# Patient Record
Sex: Female | Born: 1965
Health system: Southern US, Community
[De-identification: ages and names within clinical notes are randomized; demographics above are authoritative.]

## PROBLEM LIST (undated history)

## (undated) DIAGNOSIS — Z923 Personal history of irradiation: Secondary | ICD-10-CM

## (undated) DIAGNOSIS — K5909 Other constipation: Secondary | ICD-10-CM

## (undated) DIAGNOSIS — E739 Lactose intolerance, unspecified: Secondary | ICD-10-CM

## (undated) DIAGNOSIS — K648 Other hemorrhoids: Secondary | ICD-10-CM

## (undated) DIAGNOSIS — I10 Essential (primary) hypertension: Secondary | ICD-10-CM

## (undated) DIAGNOSIS — Z973 Presence of spectacles and contact lenses: Secondary | ICD-10-CM

## (undated) DIAGNOSIS — F419 Anxiety disorder, unspecified: Secondary | ICD-10-CM

## (undated) HISTORY — DX: Essential (primary) hypertension: I10

## (undated) HISTORY — DX: Anxiety disorder, unspecified: F41.9

---

## 1997-09-28 HISTORY — PX: AUGMENTATION MAMMAPLASTY: SUR837

## 1997-09-28 HISTORY — PX: PLACEMENT OF BREAST IMPLANTS: SHX6334

## 2007-01-11 ENCOUNTER — Encounter: Admission: RE | Admit: 2007-01-11 | Discharge: 2007-01-11 | Payer: Self-pay | Admitting: Family Medicine

## 2010-03-20 ENCOUNTER — Other Ambulatory Visit: Admission: RE | Admit: 2010-03-20 | Discharge: 2010-03-20 | Payer: Self-pay | Admitting: Family Medicine

## 2011-02-03 ENCOUNTER — Other Ambulatory Visit: Payer: Self-pay | Admitting: Family Medicine

## 2011-02-03 ENCOUNTER — Ambulatory Visit
Admission: RE | Admit: 2011-02-03 | Discharge: 2011-02-03 | Disposition: A | Discharge status: Home / Self Care | Payer: 59 | Source: Ambulatory Visit | Attending: Family Medicine | Admitting: Family Medicine

## 2011-02-03 DIAGNOSIS — M549 Dorsalgia, unspecified: Secondary | ICD-10-CM

## 2012-05-12 ENCOUNTER — Other Ambulatory Visit: Payer: Self-pay | Admitting: Family Medicine

## 2012-05-12 DIAGNOSIS — N6325 Unspecified lump in the left breast, overlapping quadrants: Secondary | ICD-10-CM

## 2012-05-12 DIAGNOSIS — N644 Mastodynia: Secondary | ICD-10-CM

## 2012-05-13 ENCOUNTER — Ambulatory Visit
Admission: RE | Admit: 2012-05-13 | Discharge: 2012-05-13 | Disposition: A | Discharge status: Home / Self Care | Payer: 59 | Source: Ambulatory Visit | Attending: Family Medicine | Admitting: Family Medicine

## 2012-05-13 ENCOUNTER — Other Ambulatory Visit: Payer: Self-pay | Admitting: Family Medicine

## 2012-05-13 ENCOUNTER — Other Ambulatory Visit: Payer: Self-pay

## 2012-05-13 DIAGNOSIS — N6002 Solitary cyst of left breast: Secondary | ICD-10-CM

## 2012-05-13 DIAGNOSIS — N644 Mastodynia: Secondary | ICD-10-CM

## 2012-05-13 DIAGNOSIS — N6325 Unspecified lump in the left breast, overlapping quadrants: Secondary | ICD-10-CM

## 2012-07-20 ENCOUNTER — Other Ambulatory Visit: Payer: Self-pay | Admitting: Gastroenterology

## 2012-07-20 DIAGNOSIS — R1011 Right upper quadrant pain: Secondary | ICD-10-CM

## 2012-07-26 ENCOUNTER — Ambulatory Visit
Admission: RE | Admit: 2012-07-26 | Discharge: 2012-07-26 | Disposition: A | Discharge status: Home / Self Care | Payer: 59 | Source: Ambulatory Visit | Attending: Gastroenterology | Admitting: Gastroenterology

## 2012-07-26 DIAGNOSIS — R1011 Right upper quadrant pain: Secondary | ICD-10-CM

## 2014-08-10 ENCOUNTER — Other Ambulatory Visit: Payer: Self-pay | Admitting: Family Medicine

## 2014-08-10 ENCOUNTER — Other Ambulatory Visit (HOSPITAL_COMMUNITY)
Admission: RE | Admit: 2014-08-10 | Discharge: 2014-08-10 | Disposition: A | Discharge status: Home / Self Care | Payer: 59 | Source: Ambulatory Visit | Attending: Family Medicine | Admitting: Family Medicine

## 2014-08-10 DIAGNOSIS — Z124 Encounter for screening for malignant neoplasm of cervix: Secondary | ICD-10-CM | POA: Diagnosis not present

## 2014-08-14 LAB — CYTOLOGY - PAP

## 2014-09-28 HISTORY — PX: BREAST IMPLANT EXCHANGE: SHX6296

## 2014-11-09 ENCOUNTER — Other Ambulatory Visit: Payer: Self-pay | Admitting: Family Medicine

## 2014-11-09 DIAGNOSIS — M5416 Radiculopathy, lumbar region: Secondary | ICD-10-CM

## 2014-11-11 ENCOUNTER — Ambulatory Visit
Admission: RE | Admit: 2014-11-11 | Discharge: 2014-11-11 | Disposition: A | Discharge status: Home / Self Care | Payer: 59 | Source: Ambulatory Visit | Attending: Family Medicine | Admitting: Family Medicine

## 2014-11-11 DIAGNOSIS — M5416 Radiculopathy, lumbar region: Secondary | ICD-10-CM

## 2015-08-26 ENCOUNTER — Ambulatory Visit: Payer: Self-pay | Admitting: General Surgery

## 2015-08-26 NOTE — H&P (Signed)
Toni Bowers 08/26/2015 3:07 PM Location: [facility] Patient #: [id number] DOB: [birth year] Married / Language: Cleophus Molt / Race: White Female  History of Present Illness Toni Hollingshead MD; 08/26/2015 3:27 PM) The patient is a 49 year old female.   Note:She is here for visit following rubber band ligation of her right posterior lateral internal hemorrhoid. She is having less bleeding but still has some prolapse. Still has a bowel movement every other day with some straining at times.  Allergies Elbert Ewings, CMA; 08/26/2015 3:07 PM) Codeine Phosphate *ANALGESICS - OPIOID* Nausea. Sertraline HCl *ANTIDEPRESSANTS* Anaphylaxis. Adhesive 1"x6yd *MEDICAL DEVICES AND SUPPLIES* Rash.  Medication History Elbert Ewings, CMA; 08/26/2015 3:08 PM) BusPIRone HCl (10MG  Tablet, Oral daily) Active. Azelastine HCl (0.1% Solution, Nasal prn) Active. Calcium 600 (1500 (600 Ca)MG Tablet, Oral daily) Active. Align (Oral daily) Active. FiberCon (625MG  Tablet, 1-2 Oral daily) Active. Biotin (5000MCG Tablet, Oral daily) Active. Multiple Vitamin (Oral) Active. Fish Oil (1200MG  Capsule, Oral half of a pill daily) Active. Medications Reconciled    Vitals Elbert Ewings CMA; 08/26/2015 3:08 PM) 08/26/2015 3:08 PM Weight: 108 lb Height: 63in Body Surface Area: 1.49 m Body Mass Index: 19.13 kg/m  Temp.: 97.38F(Temporal)  Pulse: 67 (Regular)  BP: 124/74 (Sitting, Left Arm, Standard)      Physical Exam Toni Hollingshead MD; 08/26/2015 3:28 PM)  The physical exam findings are as follows: Note:Anorectal-no external lesions.  Anoscopy demonstrates a much smaller right posterior lateral hemorrhoid with the anal papilla still present. There appears to be an inflamed small right anterior hemorrhoid. There is also a pedunculated mass, which was not appreciated with the first exam, the prolapses out with manipulation on the left side. Likely a prolapsing  pedunculated internal hemorrhoid. This is smooth without inflammation.    Assessment & Plan Toni Hollingshead MD; 08/26/2015 3:30 PM)  HEMORRHOIDS, INTERNAL, WITH BLEEDING (K64.8) Impression: Bleeding has improved but prolapse from what appears to be a left lateral hemorrhoid is present. The right anterior hemorrhoid also looks inflamed now. This is more that can be done with office treatment.  Plan: I recommended a outpatient hemorrhoidectomy and she is in agreement with it. We discussed postoperative bowel regimen to try to avoid postoperative constipation. The procedure, after care, and risks were discussed with her. Risks including but limited to bleeding, infection, recurrence, anesthesia. We also talked about discomfort. She seems to understand all this and would like to proceed.  Jackolyn Confer, MD

## 2015-10-10 ENCOUNTER — Encounter (HOSPITAL_BASED_OUTPATIENT_CLINIC_OR_DEPARTMENT_OTHER): Payer: Self-pay | Admitting: *Deleted

## 2015-10-10 NOTE — Progress Notes (Signed)
NPO AFTER MN.  ARRIVE AT 0600.  GETTING LAB WORK DONE MON. 10-14-2015 OR TUES. 10-15-2015.  NEEDS URINE PREG.  WILL TAKE AM MED W/ SIPS OF WATER.

## 2015-10-14 DIAGNOSIS — Z79899 Other long term (current) drug therapy: Secondary | ICD-10-CM | POA: Diagnosis not present

## 2015-10-14 DIAGNOSIS — K648 Other hemorrhoids: Secondary | ICD-10-CM | POA: Diagnosis present

## 2015-10-14 DIAGNOSIS — F17200 Nicotine dependence, unspecified, uncomplicated: Secondary | ICD-10-CM | POA: Diagnosis not present

## 2015-10-14 DIAGNOSIS — Z791 Long term (current) use of non-steroidal anti-inflammatories (NSAID): Secondary | ICD-10-CM | POA: Diagnosis not present

## 2015-10-14 LAB — CBC WITH DIFFERENTIAL/PLATELET
BASOS ABS: 0 10*3/uL (ref 0.0–0.1)
BASOS PCT: 0 %
EOS ABS: 0.1 10*3/uL (ref 0.0–0.7)
Eosinophils Relative: 1 %
HCT: 42.3 % (ref 36.0–46.0)
HEMOGLOBIN: 14.3 g/dL (ref 12.0–15.0)
Lymphocytes Relative: 26 %
Lymphs Abs: 2.7 10*3/uL (ref 0.7–4.0)
MCH: 33.3 pg (ref 26.0–34.0)
MCHC: 33.8 g/dL (ref 30.0–36.0)
MCV: 98.4 fL (ref 78.0–100.0)
MONOS PCT: 4 %
Monocytes Absolute: 0.4 10*3/uL (ref 0.1–1.0)
NEUTROS PCT: 69 %
Neutro Abs: 6.9 10*3/uL (ref 1.7–7.7)
Platelets: 222 10*3/uL (ref 150–400)
RBC: 4.3 MIL/uL (ref 3.87–5.11)
RDW: 12.5 % (ref 11.5–15.5)
WBC: 10.1 10*3/uL (ref 4.0–10.5)

## 2015-10-14 LAB — COMPREHENSIVE METABOLIC PANEL
ALBUMIN: 4.7 g/dL (ref 3.5–5.0)
ALK PHOS: 66 U/L (ref 38–126)
ALT: 18 U/L (ref 14–54)
ANION GAP: 9 (ref 5–15)
AST: 24 U/L (ref 15–41)
BUN: 11 mg/dL (ref 6–20)
CALCIUM: 9.9 mg/dL (ref 8.9–10.3)
CO2: 29 mmol/L (ref 22–32)
Chloride: 102 mmol/L (ref 101–111)
Creatinine, Ser: 0.71 mg/dL (ref 0.44–1.00)
GFR calc Af Amer: >60 mL/min (ref >60)
GFR calc non Af Amer: >60 mL/min (ref >60)
GLUCOSE: 102 mg/dL — AB (ref 65–99)
POTASSIUM: 4 mmol/L (ref 3.5–5.1)
SODIUM: 140 mmol/L (ref 135–145)
Total Bilirubin: 0.9 mg/dL (ref 0.3–1.2)
Total Protein: 7.4 g/dL (ref 6.5–8.1)

## 2015-10-14 LAB — PROTIME-INR
INR: 0.96 (ref 0.00–1.49)
Prothrombin Time: 12.9 seconds (ref 11.6–15.2)

## 2015-10-17 ENCOUNTER — Ambulatory Visit (HOSPITAL_BASED_OUTPATIENT_CLINIC_OR_DEPARTMENT_OTHER)
Admission: RE | Admit: 2015-10-17 | Discharge: 2015-10-17 | Disposition: A | Discharge status: Home / Self Care | Payer: 59 | Source: Ambulatory Visit | Attending: General Surgery | Admitting: General Surgery

## 2015-10-17 ENCOUNTER — Encounter (HOSPITAL_BASED_OUTPATIENT_CLINIC_OR_DEPARTMENT_OTHER): Payer: Self-pay

## 2015-10-17 ENCOUNTER — Encounter (HOSPITAL_BASED_OUTPATIENT_CLINIC_OR_DEPARTMENT_OTHER)
Admission: RE | Disposition: A | Discharge status: Home / Self Care | Payer: Self-pay | Source: Ambulatory Visit | Attending: General Surgery

## 2015-10-17 ENCOUNTER — Ambulatory Visit (HOSPITAL_BASED_OUTPATIENT_CLINIC_OR_DEPARTMENT_OTHER): Payer: 59 | Admitting: Anesthesiology

## 2015-10-17 DIAGNOSIS — Z79899 Other long term (current) drug therapy: Secondary | ICD-10-CM | POA: Insufficient documentation

## 2015-10-17 DIAGNOSIS — K648 Other hemorrhoids: Secondary | ICD-10-CM | POA: Diagnosis not present

## 2015-10-17 DIAGNOSIS — Z791 Long term (current) use of non-steroidal anti-inflammatories (NSAID): Secondary | ICD-10-CM | POA: Insufficient documentation

## 2015-10-17 DIAGNOSIS — F17200 Nicotine dependence, unspecified, uncomplicated: Secondary | ICD-10-CM | POA: Insufficient documentation

## 2015-10-17 HISTORY — DX: Presence of spectacles and contact lenses: Z97.3

## 2015-10-17 HISTORY — DX: Other constipation: K59.09

## 2015-10-17 HISTORY — PX: HEMORRHOID SURGERY: SHX153

## 2015-10-17 HISTORY — DX: Lactose intolerance, unspecified: E73.9

## 2015-10-17 HISTORY — DX: Other hemorrhoids: K64.8

## 2015-10-17 LAB — POCT PREGNANCY, URINE: Preg Test, Ur: NEGATIVE

## 2015-10-17 SURGERY — HEMORRHOIDECTOMY
Anesthesia: General

## 2015-10-17 MED ORDER — SODIUM CHLORIDE 0.9 % IJ SOLN
3.0000 mL | INTRAMUSCULAR | Status: DC | PRN
  Filled 2015-10-17: qty 3

## 2015-10-17 MED ORDER — MIDAZOLAM HCL 2 MG/2ML IJ SOLN
INTRAMUSCULAR | Status: AC
  Filled 2015-10-17: qty 2

## 2015-10-17 MED ORDER — OXYCODONE HCL 5 MG PO TABS: 5.0000 mg | ORAL_TABLET | ORAL | Status: DC | PRN

## 2015-10-17 MED ORDER — DEXAMETHASONE SODIUM PHOSPHATE 10 MG/ML IJ SOLN
INTRAMUSCULAR | Status: AC
Start: 2015-10-17 — End: 2015-10-17
  Filled 2015-10-17: qty 1

## 2015-10-17 MED ORDER — SODIUM CHLORIDE 0.9 % IR SOLN
Status: DC | PRN
  Administered 2015-10-17: 500 mL
  Administered 2015-10-17: 10 mL

## 2015-10-17 MED ORDER — CEFOTETAN DISODIUM-DEXTROSE 2-2.08 GM-% IV SOLR
INTRAVENOUS | Status: AC
  Filled 2015-10-17: qty 50

## 2015-10-17 MED ORDER — BUPIVACAINE LIPOSOME 1.3 % IJ SUSP
INTRAMUSCULAR | Status: DC | PRN
  Administered 2015-10-17: 27 mL

## 2015-10-17 MED ORDER — ONDANSETRON HCL 4 MG/2ML IJ SOLN
INTRAMUSCULAR | Status: DC | PRN
  Administered 2015-10-17: 4 mg via INTRAVENOUS

## 2015-10-17 MED ORDER — FENTANYL CITRATE (PF) 100 MCG/2ML IJ SOLN
INTRAMUSCULAR | Status: AC
  Filled 2015-10-17: qty 2

## 2015-10-17 MED ORDER — DEXAMETHASONE SODIUM PHOSPHATE 4 MG/ML IJ SOLN
INTRAMUSCULAR | Status: DC | PRN
  Administered 2015-10-17: 10 mg via INTRAVENOUS

## 2015-10-17 MED ORDER — LIDOCAINE HCL (CARDIAC) 20 MG/ML IV SOLN
INTRAVENOUS | Status: DC | PRN
  Administered 2015-10-17: 70 mg via INTRAVENOUS

## 2015-10-17 MED ORDER — PROMETHAZINE HCL 25 MG/ML IJ SOLN
6.2500 mg | INTRAMUSCULAR | Status: DC | PRN
  Filled 2015-10-17: qty 1

## 2015-10-17 MED ORDER — ACETAMINOPHEN 650 MG RE SUPP
650.0000 mg | RECTAL | Status: DC | PRN
  Filled 2015-10-17: qty 1

## 2015-10-17 MED ORDER — ONDANSETRON HCL 4 MG PO TABS: 4.0000 mg | ORAL_TABLET | ORAL | Status: DC | PRN

## 2015-10-17 MED ORDER — HYDROMORPHONE HCL 1 MG/ML IJ SOLN
0.2500 mg | INTRAMUSCULAR | Status: DC | PRN
  Filled 2015-10-17: qty 1

## 2015-10-17 MED ORDER — DEXTROSE 5 % IV SOLN
2.0000 g | INTRAVENOUS | Status: AC
  Administered 2015-10-17: 2 g via INTRAVENOUS
  Filled 2015-10-17: qty 2

## 2015-10-17 MED ORDER — LACTATED RINGERS IV SOLN
INTRAVENOUS | Status: DC
  Administered 2015-10-17 (×2): via INTRAVENOUS
  Filled 2015-10-17: qty 1000

## 2015-10-17 MED ORDER — MIDAZOLAM HCL 5 MG/5ML IJ SOLN
INTRAMUSCULAR | Status: DC | PRN
  Administered 2015-10-17: 2 mg via INTRAVENOUS
  Administered 2015-10-17: 1 mg via INTRAVENOUS

## 2015-10-17 MED ORDER — LIDOCAINE HCL (CARDIAC) 20 MG/ML IV SOLN
INTRAVENOUS | Status: AC
  Filled 2015-10-17: qty 10

## 2015-10-17 MED ORDER — PROPOFOL 10 MG/ML IV BOLUS
INTRAVENOUS | Status: AC
  Filled 2015-10-17: qty 20

## 2015-10-17 MED ORDER — OXYCODONE HCL 5 MG/5ML PO SOLN
5.0000 mg | Freq: Once | ORAL | Status: DC | PRN
  Filled 2015-10-17: qty 5

## 2015-10-17 MED ORDER — ONDANSETRON HCL 4 MG/2ML IJ SOLN
INTRAMUSCULAR | Status: AC
Start: 2015-10-17 — End: 2015-10-17
  Filled 2015-10-17: qty 2

## 2015-10-17 MED ORDER — SUCCINYLCHOLINE CHLORIDE 20 MG/ML IJ SOLN
INTRAMUSCULAR | Status: DC | PRN
  Administered 2015-10-17: 100 mg via INTRAVENOUS

## 2015-10-17 MED ORDER — GELATIN ABSORBABLE 100 EX MISC
CUTANEOUS | Status: DC | PRN
Start: 2015-10-17 — End: 2015-10-17
  Administered 2015-10-17: 1

## 2015-10-17 MED ORDER — ARTIFICIAL TEARS OP OINT
TOPICAL_OINTMENT | OPHTHALMIC | Status: AC
  Filled 2015-10-17: qty 3.5

## 2015-10-17 MED ORDER — OXYCODONE HCL 5 MG PO TABS
5.0000 mg | ORAL_TABLET | Freq: Once | ORAL | Status: DC | PRN
  Filled 2015-10-17: qty 1

## 2015-10-17 MED ORDER — FENTANYL CITRATE (PF) 100 MCG/2ML IJ SOLN
INTRAMUSCULAR | Status: DC | PRN
  Administered 2015-10-17 (×2): 50 ug via INTRAVENOUS

## 2015-10-17 MED ORDER — ACETAMINOPHEN 325 MG PO TABS
650.0000 mg | ORAL_TABLET | ORAL | Status: DC | PRN
  Filled 2015-10-17: qty 2

## 2015-10-17 MED ORDER — PROPOFOL 10 MG/ML IV BOLUS
INTRAVENOUS | Status: DC | PRN
  Administered 2015-10-17: 150 mg via INTRAVENOUS

## 2015-10-17 MED ORDER — OXYCODONE HCL 5 MG PO TABS
5.0000 mg | ORAL_TABLET | ORAL | Status: DC | PRN
  Filled 2015-10-17: qty 2

## 2015-10-17 MED FILL — ONDANSETRON HCL 4 MG TABLET: 4 | 5 days supply | Qty: 30 | Fill #0

## 2015-10-17 MED FILL — oxyCODONE HCL 5 MG TABS: 5 | 3 days supply | Qty: 40 | Fill #0

## 2015-10-17 SURGICAL SUPPLY — 46 items
APL SKNCLS STERI-STRIP NONHPOA (GAUZE/BANDAGES/DRESSINGS) ×1
BENZOIN TINCTURE PRP APPL 2/3 (GAUZE/BANDAGES/DRESSINGS) ×1 IMPLANT
BLADE SURG 10 STRL SS (BLADE) IMPLANT
BRIEF STRETCH FOR OB PAD LRG (UNDERPADS AND DIAPERS) ×1 IMPLANT
CANISTER SUCTION 1200CC (MISCELLANEOUS) ×2 IMPLANT
COVER BACK TABLE 60X90IN (DRAPES) ×2 IMPLANT
COVER MAYO STAND STRL (DRAPES) ×2 IMPLANT
DRAPE LAPAROTOMY 100X72 PEDS (DRAPES) ×2 IMPLANT
DRAPE LG THREE QUARTER DISP (DRAPES) IMPLANT
DRAPE UNDERBUTTOCKS STRL (DRAPE) IMPLANT
DRAPE UTILITY XL STRL (DRAPES) ×1 IMPLANT
DRSG PAD ABDOMINAL 8X10 ST (GAUZE/BANDAGES/DRESSINGS) ×1 IMPLANT
ELECT REM PT RETURN 9FT ADLT (ELECTROSURGICAL) ×2
ELECTRODE REM PT RTRN 9FT ADLT (ELECTROSURGICAL) ×1 IMPLANT
GLOVE BIO SURGEON STRL SZ8 (GLOVE) ×2 IMPLANT
GOWN STRL REUS W/ TWL XL LVL3 (GOWN DISPOSABLE) ×1 IMPLANT
GOWN STRL REUS W/TWL XL LVL3 (GOWN DISPOSABLE) ×2
IV CATH 22GX1 FEP (IV SOLUTION) ×1 IMPLANT
KIT ROOM TURNOVER WOR (KITS) ×2 IMPLANT
LEGGING LITHOTOMY PAIR STRL (DRAPES) IMPLANT
NDL HYPO 25X1 1.5 SAFETY (NEEDLE) ×1 IMPLANT
NEEDLE HYPO 25X1 1.5 SAFETY (NEEDLE) ×2 IMPLANT
NS IRRIG 500ML POUR BTL (IV SOLUTION) ×2 IMPLANT
PACK BASIN DAY SURGERY FS (CUSTOM PROCEDURE TRAY) ×2 IMPLANT
PAD ABD 8X10 STRL (GAUZE/BANDAGES/DRESSINGS) ×2 IMPLANT
PAD PREP 24X48 CUFFED NSTRL (MISCELLANEOUS) ×2 IMPLANT
PENCIL BUTTON HOLSTER BLD 10FT (ELECTRODE) ×2 IMPLANT
SHEARS HARMONIC 9CM CVD (BLADE) ×1 IMPLANT
SPONGE GAUZE 4X4 12PLY STER LF (GAUZE/BANDAGES/DRESSINGS) ×1 IMPLANT
SPONGE HEMORRHOID 8X3CM (HEMOSTASIS) IMPLANT
SURGILUBE 2OZ TUBE FLIPTOP (MISCELLANEOUS) ×2 IMPLANT
SUT CHROMIC 2 0 SH (SUTURE) IMPLANT
SUT CHROMIC 3 0 PS 2 (SUTURE) IMPLANT
SUT CHROMIC 3 0 SH 27 (SUTURE) IMPLANT
SUT MON AB 4-0 PC3 18 (SUTURE) IMPLANT
SUT SILK 2 0 SH (SUTURE) IMPLANT
SUT VIC AB 4-0 P-3 18XBRD (SUTURE) IMPLANT
SUT VIC AB 4-0 P3 18 (SUTURE)
SYR BULB IRRIGATION 50ML (SYRINGE) ×1 IMPLANT
SYR CONTROL 10ML LL (SYRINGE) ×2 IMPLANT
TOWEL OR 17X24 6PK STRL BLUE (TOWEL DISPOSABLE) ×4 IMPLANT
TRAY DSU PREP LF (CUSTOM PROCEDURE TRAY) ×2 IMPLANT
TUBE CONNECTING 12X1/4 (SUCTIONS) ×1 IMPLANT
UNDERPAD 30X30 INCONTINENT (UNDERPADS AND DIAPERS) ×3 IMPLANT
WATER STERILE IRR 500ML POUR (IV SOLUTION) IMPLANT
YANKAUER SUCT BULB TIP NO VENT (SUCTIONS) ×2 IMPLANT

## 2015-10-17 NOTE — Op Note (Signed)
Operative Note  Toni Bowers female 50 y.o. 10/17/2015  PREOPERATIVE DX:  Bleeding and prolapsing internal hemorrhoids  POSTOPERATIVE DX:  Same  PROCEDURE:   Anal block. Three column internal hemorrhoidectomy         Surgeon: [name]   Assistants: None  Anesthesia: General endotracheal anesthesia with Exparel anal block  Indications:   This is a 50 year old female with bleeding and prolapsing internal hemorrhoids. Rubber band ligation was performed in the office but she she continues to have the problem. She now presents for the above procedure.    Procedure Detail:  She was brought to the operating room supine on the stretcher in the general anesthetic was given. She was placed in the prone jackknife position and padded appropriately.  The perianal area was sterilely prepped and draped. A timeout was performed.  Using Exparel solution, an anal block was performed.  The endoscope was inserted and an enlarged right posterior internal hemorrhoid was identified. Using the Harmonic scalpel, it was removed with care taken to preserve the sphincter muscles.  Next, an enlarged left lateral and prolapsing internal hemorrhoid was identified and it was removed using Harmonic scalpel with care taken to preserve the sphincter muscles. Lastly, a moderately enlargedright anterior hemorrhoid was identified and removed using the Harmonic scalpel with care taken to preserve the sphincter muscles.  The 3 specimens were  Marked appropriately and then sent to pathology. The anal canal was carefully inspected and hemostasis was adequate.A piece of Gelfoam was placed in the anal canal. A bulky dressing was applied.  Subsequently, she was turned supine on the stretcher, extubated, and taken to the recovery room in satisfactory condition. Blood loss was less than 100 cc. There were no apparent complications.    Findings: Enlarged internal hemorrhoids at the right posterior, right anterior, and  left lateral positions with the left lateral hemorrhoid prolapsing.         Specimens: Hemorrhoidal tissue

## 2015-10-17 NOTE — Anesthesia Preprocedure Evaluation (Addendum)
Anesthesia Evaluation  Patient identified by MRN, date of birth, ID band Patient awake    Reviewed: Allergy & Precautions, NPO status , Patient's Chart, lab work & pertinent test results  Airway Mallampati: II  TM Distance: >3 FB Neck ROM: Full    Dental  (+) Dental Advisory Given, Teeth Intact   Pulmonary Current Smoker,    breath sounds clear to auscultation       Cardiovascular negative cardio ROS   Rhythm:Regular Rate:Normal     Neuro/Psych negative neurological ROS     GI/Hepatic negative GI ROS, Neg liver ROS,   Endo/Other  negative endocrine ROS  Renal/GU negative Renal ROS     Musculoskeletal   Abdominal   Peds  Hematology negative hematology ROS (+)   Anesthesia Other Findings   Reproductive/Obstetrics                            Lab Results  Component Value Date   WBC 10.1 10/14/2015   HGB 14.3 10/14/2015   HCT 42.3 10/14/2015   MCV 98.4 10/14/2015   PLT 222 10/14/2015   Lab Results  Component Value Date   CREATININE 0.71 10/14/2015   BUN 11 10/14/2015   NA 140 10/14/2015   K 4.0 10/14/2015   CL 102 10/14/2015   CO2 29 10/14/2015    Anesthesia Physical Anesthesia Plan  ASA: I  Anesthesia Plan: General   Post-op Pain Management:    Induction: Intravenous  Airway Management Planned: Oral ETT  Additional Equipment:   Intra-op Plan:   Post-operative Plan: Extubation in OR  Informed Consent: I have reviewed the patients History and Physical, chart, labs and discussed the procedure including the risks, benefits and alternatives for the proposed anesthesia with the patient or authorized representative who has indicated his/her understanding and acceptance.   Dental advisory given  Plan Discussed with: CRNA  Anesthesia Plan Comments:         Anesthesia Quick Evaluation

## 2015-10-17 NOTE — Transfer of Care (Signed)
Last Vitals:  Filed Vitals:   10/17/15 0611  BP: 140/75  Pulse: 57  Temp: 36.7 C  Resp: 14    Immediate Anesthesia Transfer of Care Note  Patient: Toni Bowers  Procedure(s) Performed: Procedure(s) (LRB): HEMORRHOIDECTOMY (N/A)  Patient Location: PACU  Anesthesia Type: General  Level of Consciousness: awake, alert  and oriented  Airway & Oxygen Therapy: Patient Spontanous Breathing and Patient connected to nasal cannula oxygen  Post-op Assessment: Report given to PACU RN and Post -op Vital signs reviewed and stable  Post vital signs: Reviewed and stable  Complications: No apparent anesthesia complications

## 2015-10-17 NOTE — Anesthesia Postprocedure Evaluation (Signed)
Anesthesia Post Note  Patient: Toni Bowers  Procedure(s) Performed: Procedure(s) (LRB): HEMORRHOIDECTOMY (N/A)  Patient location during evaluation: PACU Anesthesia Type: General Level of consciousness: awake and alert Pain management: pain level controlled Vital Signs Assessment: post-procedure vital signs reviewed and stable Respiratory status: spontaneous breathing Cardiovascular status: blood pressure returned to baseline Anesthetic complications: no    Last Vitals:  Filed Vitals:   10/17/15 0931 10/17/15 1010  BP: 159/85 156/71  Pulse: 57 54  Temp:    Resp: 16 18    Last Pain:  Filed Vitals:   10/17/15 1014  PainSc: Asleep                 Tiajuana Amass

## 2015-10-17 NOTE — Anesthesia Procedure Notes (Signed)
Procedure Name: Intubation Date/Time: 10/17/2015 7:47 AM Performed by: Mechele Claude Pre-anesthesia Checklist: Patient identified, Emergency Drugs available, Suction available and Patient being monitored Patient Re-evaluated:Patient Re-evaluated prior to inductionOxygen Delivery Method: Circle System Utilized Preoxygenation: Pre-oxygenation with 100% oxygen Intubation Type: IV induction Ventilation: Mask ventilation without difficulty Laryngoscope Size: Mac and 3 Grade View: Grade II Tube type: Oral Tube size: 7.0 mm Number of attempts: 1 Airway Equipment and Method: Stylet and LTA kit utilized Placement Confirmation: ETT inserted through vocal cords under direct vision,  positive ETCO2 and breath sounds checked- equal and bilateral Secured at: 20 cm Tube secured with: Tape Dental Injury: Teeth and Oropharynx as per pre-operative assessment

## 2015-10-17 NOTE — H&P (Signed)
Toni Bowers   Patient #: [id number] DOB: 1965/10/14 Married / Language: Cleophus Molt / Race: White Female  History of Present Illness  The patient is a 50 year old female.   Note:She is here for visit following rubber band ligation of her right posterior lateral internal hemorrhoid. She is having less bleeding but still has some prolapse. Still has a bowel movement every other day with some straining at times.  Allergies Codeine Phosphate *ANALGESICS - OPIOID* Nausea. Sertraline HCl *ANTIDEPRESSANTS* Anaphylaxis. Adhesive 1"x6yd *MEDICAL DEVICES AND SUPPLIES* Rash.  Prior to Admission medications   Medication Sig Start Date End Date Taking? Authorizing Provider  Biotin 5000 MCG CAPS Take 1 capsule by mouth every morning.   Yes Historical Provider, MD  busPIRone (BUSPAR) 10 MG tablet Take 5 mg by mouth 2 (two) times daily.   Yes Historical Provider, MD  Calcium Carbonate-Vitamin D (CALCIUM 600+D) 600-400 MG-UNIT tablet Take 1 tablet by mouth daily.   Yes Historical Provider, MD  ibuprofen (ADVIL,MOTRIN) 200 MG tablet Take 800 mg by mouth every 6 (six) hours as needed.   Yes Historical Provider, MD  Multiple Vitamin (MULTIVITAMIN) tablet Take 1 tablet by mouth daily.   Yes Historical Provider, MD  Omega-3 Fatty Acids (FISH OIL) 1200 MG CPDR Take 1 capsule by mouth every morning.   Yes Historical Provider, MD  vitamin B-12 (CYANOCOBALAMIN) 1000 MCG tablet Take 1,000 mcg by mouth daily.   Yes Historical Provider, MD      Physical Exam   The physical exam findings are as follows: Note:Anorectal-no external lesions.  Anoscopy demonstrates a much smaller right posterior lateral hemorrhoid with the anal papilla still present. There appears to be an inflamed small right anterior hemorrhoid. There is also a pedunculated mass, which was not appreciated with the first exam, the prolapses out with manipulation on the left side. Likely a prolapsing pedunculated internal hemorrhoid. This is  smooth without inflammation.    Assessment & Plan   HEMORRHOIDS, INTERNAL, WITH BLEEDING (K64.8) Impression: Bleeding has improved but prolapse from what appears to be a left lateral hemorrhoid is present. The right anterior hemorrhoid also looks inflamed now. This is more that can be done with office treatment.  Plan: I recommended a outpatient hemorrhoidectomy and she is in agreement with it. We discussed postoperative bowel regimen to try to avoid postoperative constipation. The procedure, after care, and risks were discussed with her. Risks including but limited to bleeding, infection, recurrence, anesthesia. We also talked about discomfort. She seems to understand all this and would like to proceed.  Jackolyn Confer, MD

## 2015-10-17 NOTE — Discharge Instructions (Addendum)
[facility] _______[facility]  RECTAL SURGERY POST OP INSTRUCTIONS: POST OP INSTRUCTIONS  Always review your discharge instruction sheet given to you by the facility where your surgery was performed. IF YOU HAVE DISABILITY OR FAMILY LEAVE FORMS, YOU MUST BRING THEM TO THE OFFICE FOR PROCESSING.   DO NOT GIVE THEM TO YOUR DOCTOR.  1. A  prescription for pain medication may be given to you upon discharge.  Take your pain medication as prescribed, if needed.  If narcotic pain medicine is not needed, then you may take acetaminophen (Tylenol) or ibuprofen (Advil) as needed. 2. Take your usually prescribed medications unless otherwise directed. 3. If you need a refill on your pain medication, please contact your pharmacy.  They will contact our office to request authorization. Prescriptions will not be filled after 5 pm or on week-ends. 4. You should follow a liquid diet the first 2 days after arrival home, such as soup and crackers, etc.  Be sure to include lots of fluids daily.  On days 3-7, soft diet.  After day 7, high fiber diet. 5. Most patients will experience some swelling and discomfort in the rectal area. Ice packs, reclining and warm tub soaks will help.  Swelling and discomfort can take several days to resolve.  6. It is common to experience some constipation if taking pain medication after surgery.  Increasing fluid intake and taking a stool softener (such as Colace) will usually help or prevent this problem from occurring.  A mild laxative (Milk of Magnesia or Miralax) should be taken according to package directions if there are no bowel movements after 48 hours. 7. Unless discharge instructions indicate otherwise, leave your bandage dry and in place for 24 hours, or remove the bandage if you have a bowel movement. You may notice a small amount of bleeding with bowel movements for the first few days. You may have some packing in the rectum which will come out over the first day or  two. You will need to wear an absorbent pad or soft cotton gauze in your underwear until the drainage stops.it. 8. ACTIVITIES:  You may resume regular (light) daily activities beginning the next day--such as daily self-care, walking, climbing stairs--gradually increasing activities as tolerated.  You may have sexual intercourse when it is comfortable.  Refrain from any heavy lifting or straining for one week a. You may drive when you are no longer taking prescription pain medication, you can comfortably wear a seatbelt, and you can safely maneuver your car and apply brakes. b. RETURN TO WORK: :  Full duty in one week.____________________ c.  9. You should see your doctor in the office for a follow-up appointment approximately 2-3 weeks after your surgery.  Make sure that you call for this appointment within a day or two after you arrive home to insure a convenient appointment time. 10. OTHER INSTRUCTIONS:  ____Restart fish oil in 3 days.______________________________________________________________________________________________________________________________________________________________________________________  WHEN TO CALL YOUR DOCTOR: 1. Fever over 101.0 2. Inability to urinate 3. Nausea and/or vomiting 4. Extreme swelling or bruising 5. Continued bleeding from rectum. 6. Increased pain, redness, or drainage from the incision 7. Constipation  The clinic staff is available to answer your questions during regular business hours.  Please dont hesitate to call and ask to speak to one of the nurses for clinical concerns.  If you have a medical emergency, go to the nearest emergency room or call 911.  A surgeon from [facility] is always on call at the hospital  [address] [place], [place], [place]  [address] ?  [address], [place],    [address] [phone] ? [phone] ? FAX [phone] Web site: [email or web address] Information for  Discharge Teaching: EXPAREL (bupivacaine liposome injectable suspension)   Your surgeon gave you EXPAREL(bupivacaine) in your surgical incision to help control your pain after surgery.   EXPAREL is a local anesthetic that provides pain relief by numbing the tissue around the surgical site.  EXPAREL is designed to release pain medication over time and can control pain for up to 72 hours.  Depending on how you respond to EXPAREL, you may require less pain medication during your recovery.  Possible side effects:  Temporary loss of sensation or ability to move in the area where bupivacaine was injected.  Nausea, vomiting, constipation  Rarely, numbness and tingling in your mouth or lips, lightheadedness, or anxiety may occur.  Call your doctor right away if you think you may be experiencing any of these sensations, or if you have other questions regarding possible side effects.  Follow all other discharge instructions given to you by your surgeon or nurse. Eat a healthy diet and drink plenty of water or other fluids.  If you return to the hospital for any reason within 96 hours following the administration of EXPAREL, please inform your health care providers. Post Anesthesia Home Care Instructions  Activity: Get plenty of rest for the remainder of the day. A responsible adult should stay with you for 24 hours following the procedure.  For the next 24 hours, DO NOT: -Drive a car -Paediatric nurse -Drink alcoholic beverages -Take any medication unless instructed by your physician -Make any legal decisions or sign important papers.  Meals: Start with liquid foods such as gelatin or soup. Progress to regular foods as tolerated. Avoid greasy, spicy, heavy foods. If nausea and/or vomiting occur, drink only clear liquids until the nausea and/or vomiting subsides. Call your physician if vomiting continues.  Special Instructions/Symptoms: Your throat may feel dry or sore from the  anesthesia or the breathing tube placed in your throat during surgery. If this causes discomfort, gargle with warm salt water. The discomfort should disappear within 24 hours.  If you had a scopolamine patch placed behind your ear for the management of post- operative nausea and/or vomiting:  1. The medication in the patch is effective for 72 hours, after which it should be removed.  Wrap patch in a tissue and discard in the trash. Wash hands thoroughly with soap and water. 2. You may remove the patch earlier than 72 hours if you experience unpleasant side effects which may include dry mouth, dizziness or visual disturbances. 3. Avoid touching the patch. Wash your hands with soap and water after contact with the patch.

## 2015-10-17 NOTE — Interval H&P Note (Signed)
History and Physical Interval Note:  10/17/2015 7:28 AM  Toni Bowers  has presented today for surgery, with the diagnosis of Bleeding and prolapsing internal hemorrhoids  The various methods of treatment have been discussed with the patient and family. After consideration of risks, benefits and other options for treatment, the patient has consented to  Procedure(s): HEMORRHOIDECTOMY (N/A) as a surgical intervention .  The patient's history has been reviewed, patient examined, no change in status, stable for surgery.  I have reviewed the patient's chart and labs.  Questions were answered to the patient's satisfaction.     Verdell Kincannon Lenna Sciara

## 2015-10-18 ENCOUNTER — Encounter (HOSPITAL_BASED_OUTPATIENT_CLINIC_OR_DEPARTMENT_OTHER): Payer: Self-pay | Admitting: General Surgery

## 2015-12-17 ENCOUNTER — Other Ambulatory Visit: Payer: Self-pay

## 2015-12-17 DIAGNOSIS — Z1231 Encounter for screening mammogram for malignant neoplasm of breast: Secondary | ICD-10-CM

## 2016-01-02 ENCOUNTER — Ambulatory Visit
Admission: RE | Admit: 2016-01-02 | Discharge: 2016-01-02 | Disposition: A | Discharge status: Home / Self Care | Payer: 59 | Source: Ambulatory Visit

## 2016-01-02 ENCOUNTER — Ambulatory Visit: Payer: 59

## 2016-01-02 ENCOUNTER — Other Ambulatory Visit: Payer: Self-pay

## 2016-01-02 DIAGNOSIS — Z1231 Encounter for screening mammogram for malignant neoplasm of breast: Secondary | ICD-10-CM

## 2016-01-22 ENCOUNTER — Other Ambulatory Visit: Payer: Self-pay | Admitting: Family Medicine

## 2016-01-22 DIAGNOSIS — N6311 Unspecified lump in the right breast, upper outer quadrant: Secondary | ICD-10-CM

## 2016-01-24 ENCOUNTER — Ambulatory Visit
Admission: RE | Admit: 2016-01-24 | Discharge: 2016-01-24 | Disposition: A | Discharge status: Home / Self Care | Payer: 59 | Source: Ambulatory Visit | Attending: Family Medicine | Admitting: Family Medicine

## 2016-01-24 DIAGNOSIS — N6311 Unspecified lump in the right breast, upper outer quadrant: Secondary | ICD-10-CM

## 2016-09-28 DIAGNOSIS — C50919 Malignant neoplasm of unspecified site of unspecified female breast: Secondary | ICD-10-CM

## 2016-09-28 HISTORY — DX: Malignant neoplasm of unspecified site of unspecified female breast: C50.919

## 2016-10-17 DIAGNOSIS — J209 Acute bronchitis, unspecified: Secondary | ICD-10-CM | POA: Diagnosis not present

## 2016-11-20 ENCOUNTER — Other Ambulatory Visit: Payer: Self-pay | Admitting: Family Medicine

## 2016-11-20 DIAGNOSIS — Z1231 Encounter for screening mammogram for malignant neoplasm of breast: Secondary | ICD-10-CM

## 2016-11-25 DIAGNOSIS — M542 Cervicalgia: Secondary | ICD-10-CM | POA: Diagnosis not present

## 2016-12-02 DIAGNOSIS — K6289 Other specified diseases of anus and rectum: Secondary | ICD-10-CM | POA: Diagnosis not present

## 2016-12-28 DIAGNOSIS — D72829 Elevated white blood cell count, unspecified: Secondary | ICD-10-CM | POA: Diagnosis not present

## 2017-01-04 ENCOUNTER — Ambulatory Visit
Admission: RE | Admit: 2017-01-04 | Discharge: 2017-01-04 | Disposition: A | Discharge status: Home / Self Care | Payer: 59 | Source: Ambulatory Visit | Attending: Family Medicine | Admitting: Family Medicine

## 2017-01-04 DIAGNOSIS — Z1231 Encounter for screening mammogram for malignant neoplasm of breast: Secondary | ICD-10-CM

## 2017-01-06 ENCOUNTER — Other Ambulatory Visit: Payer: Self-pay | Admitting: Family Medicine

## 2017-01-06 DIAGNOSIS — R928 Other abnormal and inconclusive findings on diagnostic imaging of breast: Secondary | ICD-10-CM

## 2017-01-12 ENCOUNTER — Other Ambulatory Visit: Payer: Self-pay | Admitting: Family Medicine

## 2017-01-12 ENCOUNTER — Ambulatory Visit
Admission: RE | Admit: 2017-01-12 | Discharge: 2017-01-12 | Disposition: A | Discharge status: Home / Self Care | Payer: 59 | Source: Ambulatory Visit | Attending: Family Medicine | Admitting: Family Medicine

## 2017-01-12 DIAGNOSIS — N6489 Other specified disorders of breast: Secondary | ICD-10-CM

## 2017-01-12 DIAGNOSIS — R921 Mammographic calcification found on diagnostic imaging of breast: Secondary | ICD-10-CM

## 2017-01-12 DIAGNOSIS — R928 Other abnormal and inconclusive findings on diagnostic imaging of breast: Secondary | ICD-10-CM

## 2017-01-12 DIAGNOSIS — R922 Inconclusive mammogram: Secondary | ICD-10-CM | POA: Diagnosis not present

## 2017-01-13 DIAGNOSIS — K6289 Other specified diseases of anus and rectum: Secondary | ICD-10-CM | POA: Diagnosis not present

## 2017-01-14 ENCOUNTER — Other Ambulatory Visit: Payer: 59

## 2017-01-19 ENCOUNTER — Ambulatory Visit
Admission: RE | Admit: 2017-01-19 | Discharge: 2017-01-19 | Disposition: A | Discharge status: Home / Self Care | Payer: 59 | Source: Ambulatory Visit | Attending: Family Medicine | Admitting: Family Medicine

## 2017-01-19 ENCOUNTER — Other Ambulatory Visit: Payer: Self-pay | Admitting: Family Medicine

## 2017-01-19 DIAGNOSIS — R921 Mammographic calcification found on diagnostic imaging of breast: Secondary | ICD-10-CM

## 2017-01-19 DIAGNOSIS — N6489 Other specified disorders of breast: Secondary | ICD-10-CM

## 2017-01-21 ENCOUNTER — Telehealth: Payer: Self-pay | Admitting: *Deleted

## 2017-01-21 NOTE — Telephone Encounter (Signed)
Confirmed [facility] for 01/27/17 at 0815 .  Instructions and contact information given.

## 2017-01-26 ENCOUNTER — Other Ambulatory Visit: Payer: Self-pay | Admitting: *Deleted

## 2017-01-26 ENCOUNTER — Encounter: Payer: Self-pay | Admitting: *Deleted

## 2017-01-26 DIAGNOSIS — C50512 Malignant neoplasm of lower-outer quadrant of left female breast: Secondary | ICD-10-CM | POA: Insufficient documentation

## 2017-01-26 DIAGNOSIS — Z170 Estrogen receptor positive status [ER+]: Principal | ICD-10-CM

## 2017-01-26 HISTORY — PX: BREAST EXCISIONAL BIOPSY: SUR124

## 2017-01-26 HISTORY — PX: BREAST LUMPECTOMY: SHX2

## 2017-01-26 NOTE — Progress Notes (Signed)
Radiation Oncology         [phone]) [phone] ________________________________  Name: Toni Bowers MRN: [id number]  Date: 01/27/2017  DOB: 1966-02-25  BS:JGGEZ,MOQHU RUTH, MD  Rolm Bookbinder, MD     REFERRING PHYSICIAN: Rolm Bookbinder, MD   DIAGNOSIS: The encounter diagnosis was Malignant neoplasm of lower-outer quadrant of left breast of female, estrogen receptor positive ([place]).   HISTORY OF PRESENT ILLNESS: Toni Bowers is a 51 y.o. female seen in the multidisciplinary breast clinic for a new diagnosis of left breast cancer. She was found to have calcifications in the left breast and right breast distortion on screening mammogram on 01/04/17. She went on to have diagnostic imaging including ultrasound which revealed a 4 mm group of amorphous calcifications  in the upper outer left breast and within the lateral left breast there was another group of calcifications measuring 10 mm in total this measured up to 1.5 cm. The axilla was not evaluated on the left. No right sided abnormalities were seen and the axilla was negative on the right.  A biopsy on 01/19/17 of the lower outer quadrant of the left breast and of the upper outer quadrant were performed as was a biopsy of the right. The right breast biopsy revealed a complex sclerosing lesion with calcifications of the right upper outer quadrant without malignancy identified.  The lower outer quadrant revealed high grade DCIS lesions with calcifications, ER/PR positive. The upper outer quadrant biopsy revealed benign soft tissue and blood. She comes today to discuss recommendations for her care.    PREVIOUS RADIATION THERAPY: No   PAST MEDICAL HISTORY:  Past Medical History:  Diagnosis Date  . Anxiety   . Chronic constipation   . Hypertension   . Internal prolapsed hemorrhoids    and bleeding  . Lactose intolerance   . Wears glasses        PAST SURGICAL HISTORY: Past Surgical History:  Procedure Laterality Date  . AUGMENTATION  MAMMAPLASTY Bilateral 1999   retro-pectoral saline  . BREAST IMPLANT EXCHANGE  01/  2016  . HEMORRHOID SURGERY N/A 10/17/2015   Procedure: HEMORRHOIDECTOMY;  Surgeon: Jackolyn Confer, MD;  Location: [facility];  Service: General;  Laterality: N/A;  . PLACEMENT OF BREAST IMPLANTS Bilateral 1999     FAMILY HISTORY:  Family History  Problem Relation Age of Onset  . Breast cancer Mother      SOCIAL HISTORY:  reports that she has been smoking Cigarettes.  She has a 30.00 pack-year smoking history. She has never used smokeless tobacco. She reports that she does not drink alcohol or use drugs.  She is married and lives in [place]. She stays at home. She has two chihuaha  dogs named Peanut and Izzy.  ALLERGIES: Codeine; Metoprolol succinate [metoprolol]; Zoloft [sertraline hcl]; and Adhesive [tape]   MEDICATIONS:  Current Outpatient Prescriptions  Medication Sig Dispense Refill  . azelastine (OPTIVAR) 0.05 % ophthalmic solution 1 drop 2 (two) times daily.    . Biotin 5000 MCG CAPS Take 1 capsule by mouth every morning.    . busPIRone (BUSPAR) 10 MG tablet Take 10 mg by mouth daily.     . Calcium Carbonate-Vitamin D (CALCIUM 600+D) 600-400 MG-UNIT tablet Take 1 tablet by mouth daily.    Marland Kitchen ibuprofen (ADVIL,MOTRIN) 200 MG tablet Take 800 mg by mouth every 6 (six) hours as needed.    Marland Kitchen lisinopril (PRINIVIL,ZESTRIL) 5 MG tablet Take 5 mg by mouth daily.    . Multiple Vitamin (MULTIVITAMIN) tablet Take 1 tablet  by mouth daily.    . Omega-3 Fatty Acids (FISH OIL) 1200 MG CAPS Take 1 capsule by mouth daily.    . ondansetron (ZOFRAN) 4 MG tablet Take 1 tablet (4 mg total) by mouth every 4 (four) hours as needed for nausea. (Patient not taking: Reported on 01/27/2017) 30 tablet 0  . oxyCODONE (OXY IR/ROXICODONE) 5 MG immediate release tablet Take 1-2 tablets (5-10 mg total) by mouth every 4 (four) hours as needed for moderate pain, severe pain or breakthrough pain. (Patient not  taking: Reported on 01/27/2017) 40 tablet 0  . polycarbophil (FIBERCON) 625 MG tablet Take 625 mg by mouth 2 (two) times daily.    . vitamin B-12 (CYANOCOBALAMIN) 1000 MCG tablet Take 1,000 mcg by mouth daily.     No current facility-administered medications for this encounter.      REVIEW OF SYSTEMS: On review of systems, the patient reports that she is doing well overall. She denies any chest pain, shortness of breath, cough, fevers, chills, night sweats, unintended weight changes. She denies any bowel or bladder disturbances, and denies abdominal pain, nausea or vomiting. She denies any new musculoskeletal or joint aches or pains. A complete review of systems is obtained and is otherwise negative.     PHYSICAL EXAM:  Wt Readings from Last 3 Encounters:  01/27/17 106 lb 12.8 oz (48.4 kg)  10/17/15 108 lb (49 kg)   Temp Readings from Last 3 Encounters:  01/27/17 97.7 F (36.5 C) (Oral)  10/17/15 97.6 F (36.4 C)   BP Readings from Last 3 Encounters:  01/27/17 (!) 157/83  10/17/15 (!) 156/71   Pulse Readings from Last 3 Encounters:  01/27/17 69  10/17/15 (!) 54      In general this is a well appearing caucasian female in no acute distress. She is alert and oriented x4 and appropriate throughout the examination. HEENT reveals that the patient is normocephalic, atraumatic. EOMs are intact. PERRLA. Skin is intact without any evidence of gross lesions. Cardiovascular exam reveals a regular rate and rhythm, no clicks rubs or murmurs are auscultated. Chest is clear to auscultation bilaterally. Lymphatic assessment is performed and does not reveal any adenopathy in the cervical, supraclavicular, axillary, or inguinal chains. Bilateral breast exam is performed and reveals small ecchymosis on the right lateral upper breast and palpable implant. The left breast has more ecchymosis and induration deep to the biopsy site and palpable implant. No nipple bleeding or discharge is noted of either  breast. Abdomen has active bowel sounds in all quadrants and is intact. The abdomen is soft, non tender, non distended. Lower extremities are negative for pretibial pitting edema, deep calf tenderness, cyanosis or clubbing.   ECOG = 0  0 - Asymptomatic (Fully active, able to carry on all predisease activities without restriction)  1 - Symptomatic but completely ambulatory (Restricted in physically strenuous activity but ambulatory and able to carry out work of a light or sedentary nature. For example, light housework, office work)  2 - Symptomatic, <50% in bed during the day (Ambulatory and capable of all self care but unable to carry out any work activities. Up and about more than 50% of waking hours)  3 - Symptomatic, >50% in bed, but not bedbound (Capable of only limited self-care, confined to bed or chair 50% or more of waking hours)  4 - Bedbound (Completely disabled. Cannot carry on any self-care. Totally confined to bed or chair)  5 - Death   Lolita Rieger, Creech RH, Tormey DC, et  al. [id number]). "Toxicity and response criteria of the [facility] Group". [place] Oncol. 5 (6): 649-55    LABORATORY DATA:  Lab Results  Component Value Date   WBC 6.9 01/27/2017   HGB 14.7 01/27/2017   HCT 41.9 01/27/2017   MCV 96.8 01/27/2017   PLT 233 01/27/2017   Lab Results  Component Value Date   NA 143 01/27/2017   K 4.1 01/27/2017   CL 102 10/14/2015   CO2 30 (H) 01/27/2017   Lab Results  Component Value Date   ALT 21 01/27/2017   AST 24 01/27/2017   ALKPHOS 70 01/27/2017   BILITOT 0.51 01/27/2017      RADIOGRAPHY: US Breast Ltd Uni Right Inc Axilla  Result Date: 01/12/2017 CLINICAL DATA:  Patient recalled from screening for left breast calcifications and right breast distortion. EXAM: 2D DIGITAL DIAGNOSTIC BILATERAL MAMMOGRAM WITH IMPLANTS, CAD AND ADJUNCT TOMO ULTRASOUND RIGHT BREAST The patient has retropectoral implants. Standard and implant displaced views  were performed. COMPARISON:  Previous exam(s). ACR Breast Density Category c: The breast tissue is heterogeneously dense, which may obscure small masses. FINDINGS: Persistent distortion demonstrated within the upper-outer right breast posterior depth, further evaluated with spot tomosynthesis images. Magnification CC and true lateral views of the left breast were obtained demonstrating 2 separate groups of calcifications. Within the upper-outer left breast middle depth there is a 4 mm group of amorphous calcifications. Within the lateral left breast slightly inferiorly and posteriorly there is a 10 mm group of coarse heterogeneous calcifications. On physical exam, I palpate no discrete mass within the lateral right breast. Targeted ultrasound is performed, showing no right axillary adenopathy. No discrete sonographic correlate identified for the distortion within the lateral right breast. Mammographic images were processed with CAD. IMPRESSION: Suspicious persistent right breast distortion. Two suspicious groups of calcifications within the left breast as described above. RECOMMENDATION: Stereotactic guided core needle biopsy right breast distortion. Stereotactic guided core needle biopsy calcifications within the lateral posterior left breast. Stereotactic guided core needle biopsy amorphous calcifications upper-outer left breast. I have discussed the findings and recommendations with the patient. Results were also provided in writing at the conclusion of the visit. If applicable, a reminder letter will be sent to the patient regarding the next appointment. BI-RADS CATEGORY  4: Suspicious. Electronically Signed   By: Lovey Newcomer M.D.   On: 01/12/2017 16:39   Mm Diag Breast Tomo Bilateral  Result Date: 01/12/2017 CLINICAL DATA:  Patient recalled from screening for left breast calcifications and right breast distortion. EXAM: 2D DIGITAL DIAGNOSTIC BILATERAL MAMMOGRAM WITH IMPLANTS, CAD AND ADJUNCT TOMO ULTRASOUND  RIGHT BREAST The patient has retropectoral implants. Standard and implant displaced views were performed. COMPARISON:  Previous exam(s). ACR Breast Density Category c: The breast tissue is heterogeneously dense, which may obscure small masses. FINDINGS: Persistent distortion demonstrated within the upper-outer right breast posterior depth, further evaluated with spot tomosynthesis images. Magnification CC and true lateral views of the left breast were obtained demonstrating 2 separate groups of calcifications. Within the upper-outer left breast middle depth there is a 4 mm group of amorphous calcifications. Within the lateral left breast slightly inferiorly and posteriorly there is a 10 mm group of coarse heterogeneous calcifications. On physical exam, I palpate no discrete mass within the lateral right breast. Targeted ultrasound is performed, showing no right axillary adenopathy. No discrete sonographic correlate identified for the distortion within the lateral right breast. Mammographic images were processed with CAD. IMPRESSION: Suspicious persistent right breast distortion.  Two suspicious groups of calcifications within the left breast as described above. RECOMMENDATION: Stereotactic guided core needle biopsy right breast distortion. Stereotactic guided core needle biopsy calcifications within the lateral posterior left breast. Stereotactic guided core needle biopsy amorphous calcifications upper-outer left breast. I have discussed the findings and recommendations with the patient. Results were also provided in writing at the conclusion of the visit. If applicable, a reminder letter will be sent to the patient regarding the next appointment. BI-RADS CATEGORY  4: Suspicious. Electronically Signed   By: Lovey Newcomer M.D.   On: 01/12/2017 16:39   Mm Screening Breast W/implant Tomo Bilateral  Result Date: 01/05/2017 CLINICAL DATA:  Screening. EXAM: 2D DIGITAL SCREENING BILATERAL MAMMOGRAM WITH IMPLANTS, CAD AND  ADJUNCT TOMO The patient has retropectoral implants. Standard and implant displaced views were performed. COMPARISON:  Previous exam(s). ACR Breast Density Category d: The breast tissue is extremely dense, which lowers the sensitivity of mammography. FINDINGS: In the right breast possible distortion requires further evaluation. In the left breast calcifications require further evaluation. Images were processed with CAD. IMPRESSION: Further evaluation is suggested for possible distortion in the right breast. Further evaluation is suggested for possible calcifications in the left breast. The patient will be contacted regarding the findings, and additional imaging will be scheduled. RECOMMENDATION: Diagnostic mammogram and possibly ultrasound of both breasts. (Code:FI-B-12M) BI-RADS CATEGORY  0: Incomplete. Need additional imaging evaluation and/or prior mammograms for comparison. Electronically Signed   By: Ammie Ferrier M.D.   On: 01/05/2017 08:50   Mm Clip Placement Left  Result Date: 01/19/2017 CLINICAL DATA:  Confirmation of clip placement after stereotactic tomosynthesis core needle biopsy of 2 separate groups of calcifications in the left breast, loosely grouped calcifications in the lower outer quadrant spanning approximately 1.5 cm and a 4 mm group of amorphous calcifications in the upper outer quadrant. EXAM: DIAGNOSTIC LEFT MAMMOGRAM POST STEREOTACTIC BIOPSY COMPARISON:  Previous exam(s). FINDINGS: Mammographic images were obtained following stereotactic tomosynthesis guided biopsy of 2 groups of calcifications in the left breast. The coil shaped tissue marker clip is appropriately positioned at the site of the biopsied group of calcifications in the lower outer quadrant of the left breast at posterior depth. Calcifications from the inferomedial portion of the group were sampled and the clip is positioned at the site of the biopsied calcifications. The X shaped tissue marker clip is appropriately  positioned at the site of the biopsied faint amorphous calcifications in the upper outer quadrant of the left breast at posterior depth. Expected post biopsy changes are present at both sites without evidence of hematoma. IMPRESSION: 1. Appropriate positioning of the coil shaped tissue marker clip at the site of the biopsy group of calcifications in the lower outer quadrant of the left breast. 2. Appropriate positioning of the X shaped tissue marker clip at the site of the biopsied faint amorphous calcifications in the upper outer quadrant of the left breast. Final Assessment: Post Procedure Mammograms for Marker Placement Electronically Signed   By: Evangeline Dakin M.D.   On: 01/19/2017 12:56   Mm Clip Placement Right  Result Date: 01/19/2017 CLINICAL DATA:  Confirmation of clip placement after stereotactic tomosynthesis core needle biopsy of architectural distortion involving the upper outer quadrant of the right breast. EXAM: 3D DIAGNOSTIC RIGHT MAMMOGRAM POST STEREOTACTIC BIOPSY COMPARISON:  Previous exam(s). FINDINGS: Standard and tomosynthesis mammographic CC and mediolateral images were obtained following stereotactic tomosynthesis guided biopsy of architectural distortion involving the upper outer quadrant of the right breast. The coil shaped  tissue marker clip is appropriately positioned at the site of the biopsied distortion. Expected post biopsy changes are present without evidence of hematoma. IMPRESSION: Appropriate positioning of the coil shaped tissue marker clip at the site of the biopsied architectural distortion in the upper outer quadrant of the right breast. Final Assessment: Post Procedure Mammograms for Marker Placement Electronically Signed   By: Evangeline Dakin M.D.   On: 01/19/2017 12:54   Mm Lt Breast Bx W Loc Dev 1st Lesion Image Bx Spec Stereo Guide  Addendum Date: 01/22/2017   ADDENDUM REPORT: 01/22/2017 08:16 ADDENDUM: Pathology revealed HIGH GRADE DUCTAL CARCINOMA IN SITU  WITH CALCIFICATIONS of the LEFT breast, lower outer quadrant. BENIGN SOFT TISSUE AND BLOOD of the LEFT breast, upper outer quadrant. This was found to be concordant by Dr. Peggye Fothergill. Pathology revealed COMPLEX SCLEROSING LESION WITH CALCIFICATIONS, FIBROCYSTIC CHANGE AND ADENOSIS WITH CALCIFICATIONS of the RIGHT breast, upper outer quadrant. This was found to be concordant by Dr. Peggye Fothergill, with excision recommended. Pathology results were discussed with the patient by telephone. The patient reported doing well after the biopsies with tenderness and minimal bleeding at the sites. Post biopsy instructions and care were reviewed and questions were answered. The patient was encouraged to call [facility] for any additional concerns. The patient was referred to [facility] on Jan 27, 2017. Preoperative MRI is recommended due to the patient's extreme breast density, the indwelling implants, the fact that there are calcifications elsewhere in the left breast separate from the biopsied calcifications, and the fact that there is a high risk lesion in the right breast. MRI would be useful to confirm extent of disease in both breasts. Pathology results reported by Terie Purser, RN on 01/22/2017. Electronically Signed   By: Evangeline Dakin M.D.   On: 01/22/2017 08:16   Result Date: 01/22/2017 CLINICAL DATA:  Screening detected indeterminate loose group of calcifications spanning approximately 1.5 cm involving the lower outer quadrant of the left breast at posterior depth and a 4 mm group of indeterminate amorphous calcifications involving the upper outer quadrant of the left breast at posterior depth. The patient also had a stereotactic biopsy of architectural distortion in the right breast which is reported separately. EXAM: LEFT BREAST STEREOTACTIC CORE NEEDLE BIOPSY x 2 COMPARISON:  Previous exams. FINDINGS:  The patient and I discussed the procedure of stereotactic-guided biopsy including benefits and alternatives. We discussed the high likelihood of a successful procedure. We discussed the risks of the procedure including infection, bleeding, tissue injury, clip migration, and inadequate sampling. Informed written consent was given. The usual time out protocol was performed immediately prior to the procedure. Using sterile technique with chlorhexidine as skin antisepsis, 1% lidocaine and 1% lidocaine with epinephrine as local anesthetic, under stereotactic guidance, a 9 gauge Brevera vacuum assisted device was used to perform core needle biopsy of calcifications in the lower outer quadrant of the left breast at posterior depth using a lateral approach. Specimen radiograph was performed showing calcifications in 2 or 3 of the core samples. Specimens with calcifications are identified for pathology. Lesion # 1 quadrant: Lower outer quadrant. At the conclusion of the procedure, a coil shaped tissue marker clip was deployed into the biopsy cavity. Using a similar technique as above, a 9 gauge Brevera vacuum assisted device was used to perform core needle biopsy of the amorphous calcifications in the upper outer quadrant of the left breast using a  lateral approach. Specimen radiograph was performed showing faint calcifications in 2 of the core samples. Specimens with calcifications are identified for pathology. Lesion # 2 quadrant:  Upper outer quadrant. At the conclusion of the procedure, an X shaped tissue marker clip was deployed into the biopsy cavity. Follow-up 2-view mammogram was performed and dictated separately. IMPRESSION: Stereotactic-guided biopsy of 2 separate groups of calcifications in the lower outer quadrant and the upper outer quadrant of the left breast. No apparent complications. Electronically Signed: By: Evangeline Dakin M.D. On: 01/19/2017 12:37   Mm Lt Breast Bx W Loc Dev Ea Ad Lesion Img Bx Spec  Stereo Guide  Addendum Date: 01/22/2017   ADDENDUM REPORT: 01/22/2017 08:16 ADDENDUM: Pathology revealed HIGH GRADE DUCTAL CARCINOMA IN SITU WITH CALCIFICATIONS of the LEFT breast, lower outer quadrant. BENIGN SOFT TISSUE AND BLOOD of the LEFT breast, upper outer quadrant. This was found to be concordant by Dr. Peggye Fothergill. Pathology revealed COMPLEX SCLEROSING LESION WITH CALCIFICATIONS, FIBROCYSTIC CHANGE AND ADENOSIS WITH CALCIFICATIONS of the RIGHT breast, upper outer quadrant. This was found to be concordant by Dr. Peggye Fothergill, with excision recommended. Pathology results were discussed with the patient by telephone. The patient reported doing well after the biopsies with tenderness and minimal bleeding at the sites. Post biopsy instructions and care were reviewed and questions were answered. The patient was encouraged to call [facility] for any additional concerns. The patient was referred to [facility] on Jan 27, 2017. Preoperative MRI is recommended due to the patient's extreme breast density, the indwelling implants, the fact that there are calcifications elsewhere in the left breast separate from the biopsied calcifications, and the fact that there is a high risk lesion in the right breast. MRI would be useful to confirm extent of disease in both breasts. Pathology results reported by Terie Purser, RN on 01/22/2017. Electronically Signed   By: Evangeline Dakin M.D.   On: 01/22/2017 08:16   Result Date: 01/22/2017 CLINICAL DATA:  Screening detected indeterminate loose group of calcifications spanning approximately 1.5 cm involving the lower outer quadrant of the left breast at posterior depth and a 4 mm group of indeterminate amorphous calcifications involving the upper outer quadrant of the left breast at posterior depth. The patient also had a stereotactic biopsy of architectural distortion in  the right breast which is reported separately. EXAM: LEFT BREAST STEREOTACTIC CORE NEEDLE BIOPSY x 2 COMPARISON:  Previous exams. FINDINGS: The patient and I discussed the procedure of stereotactic-guided biopsy including benefits and alternatives. We discussed the high likelihood of a successful procedure. We discussed the risks of the procedure including infection, bleeding, tissue injury, clip migration, and inadequate sampling. Informed written consent was given. The usual time out protocol was performed immediately prior to the procedure. Using sterile technique with chlorhexidine as skin antisepsis, 1% lidocaine and 1% lidocaine with epinephrine as local anesthetic, under stereotactic guidance, a 9 gauge Brevera vacuum assisted device was used to perform core needle biopsy of calcifications in the lower outer quadrant of the left breast at posterior depth using a lateral approach. Specimen radiograph was performed showing calcifications in 2 or 3 of the core samples. Specimens with calcifications are identified for pathology. Lesion # 1 quadrant: Lower outer quadrant. At the conclusion of the procedure, a coil shaped tissue marker clip was deployed into the biopsy cavity. Using a similar technique as above, a 9 gauge Brevera vacuum assisted device was  used to perform core needle biopsy of the amorphous calcifications in the upper outer quadrant of the left breast using a lateral approach. Specimen radiograph was performed showing faint calcifications in 2 of the core samples. Specimens with calcifications are identified for pathology. Lesion # 2 quadrant:  Upper outer quadrant. At the conclusion of the procedure, an X shaped tissue marker clip was deployed into the biopsy cavity. Follow-up 2-view mammogram was performed and dictated separately. IMPRESSION: Stereotactic-guided biopsy of 2 separate groups of calcifications in the lower outer quadrant and the upper outer quadrant of the left breast. No apparent  complications. Electronically Signed: By: Evangeline Dakin M.D. On: 01/19/2017 12:37   Mm Rt Breast Bx W Loc Dev 1st Lesion Image Bx Spec Stereo Guide  Addendum Date: 01/22/2017   ADDENDUM REPORT: 01/22/2017 07:01 ADDENDUM: Pathology revealed HIGH GRADE DUCTAL CARCINOMA IN SITU WITH CALCIFICATIONS of the LEFT breast, lower outer quadrant. BENIGN SOFT TISSUE AND BLOOD of the LEFT breast, upper outer quadrant. This was found to be concordant by Dr. Peggye Fothergill. Pathology revealed COMPLEX SCLEROSING LESION WITH CALCIFICATIONS, FIBROCYSTIC CHANGE AND ADENOSIS WITH CALCIFICATIONS of the RIGHT breast, upper outer quadrant. This was found to be concordant by Dr. Peggye Fothergill, with excision recommended. Pathology results were discussed with the patient by telephone. The patient reported doing well after the biopsies with tenderness and minimal bleeding at the sites. Post biopsy instructions and care were reviewed and questions were answered. The patient was encouraged to call [facility] for any additional concerns. The patient was referred to [facility] at [facility] on Jan 27, 2017. Preoperative MRI is recommended due to the patient's extreme breast density, the indwelling implants, the fact that there are calcifications elsewhere in the left breast separate from the biopsied calcifications, and the fact that there is a high risk lesion in the right breast. MRI would be useful to confirm extent of disease in both breasts. Pathology results reported by Terie Purser, RN on 01/22/2017. Electronically Signed   By: Evangeline Dakin M.D.   On: 01/22/2017 07:01   Result Date: 01/22/2017 CLINICAL DATA:  51 year old indwelling bilateral saline implants, presenting with screening detected architectural distortion involving the upper outer quadrant of the right breast. She subsequently underwent stereotactic core needle biopsy of 2  groups of calcifications in the left breast, and these procedures are reported separately. EXAM: RIGHT BREAST STEREOTACTIC CORE NEEDLE BIOPSY COMPARISON:  Previous exams. FINDINGS: The patient and I discussed the procedure of stereotactic-guided biopsy including benefits and alternatives. We discussed the high likelihood of a successful procedure. We discussed the risks of the procedure including infection, bleeding, tissue injury, clip migration, and inadequate sampling. Informed written consent was given. The usual time out protocol was performed immediately prior to the procedure. Using sterile technique with chlorhexidine as skin antisepsis, 1% lidocaine and 1% lidocaine with epinephrine as local anesthetic, under stereotactic guidance, a 9 gauge vacuum assisted device was used to perform core needle biopsy of the architectural distortion involving the upper outer quadrant of the right breast using a superior approach. Specimen radiograph was performed showing soft tissue density in the specimens. There are no associated calcifications. Lesion quadrant: Upper outer quadrant. At the conclusion of the procedure, a coil shaped tissue marker clip was deployed into the biopsy cavity. Follow-up 2-view mammogram was performed and dictated separately. IMPRESSION: Stereotactic-guided biopsy of architectural distortion involving the upper outer quadrant of the right breast. No apparent  complications. Electronically Signed: By: Evangeline Dakin M.D. On: 01/19/2017 12:52       IMPRESSION/PLAN: 1. 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 Dr. Lisbeth Renshaw discusses the pathology findings and reviews the nature of in situ breast disease. The consensus from the breast conference include breast conservation with excisional biopsy of the right breast and left lumpectomy. The patient's course would then be followed by external radiotherapy to the breast followed by antiestrogen therapy. We discussed the risks, benefits,  short, and long term effects of radiotherapy, and the patient is interested in proceeding. Dr. Lisbeth Renshaw discusses the delivery and logistics of radiotherapy and highlights deep inspiration breath hold technique due to her disease being on the left. We will see her back about 2 weeks after surgery to move forward with the simulation and planning process and anticipate starting radiotherapy about 4 weeks after surgery.    The above documentation reflects my direct findings during this shared patient visit. Please see the separate note by Dr. Lisbeth Renshaw on this date for the remainder of the patient's plan of care.    Carola Rhine, PAC

## 2017-01-27 ENCOUNTER — Ambulatory Visit
Admission: RE | Admit: 2017-01-27 | Discharge: 2017-01-27 | Disposition: A | Discharge status: Home / Self Care | Payer: 59 | Source: Ambulatory Visit | Attending: Radiation Oncology | Admitting: Radiation Oncology

## 2017-01-27 ENCOUNTER — Ambulatory Visit: Payer: 59 | Admitting: Physical Therapy

## 2017-01-27 ENCOUNTER — Other Ambulatory Visit (HOSPITAL_BASED_OUTPATIENT_CLINIC_OR_DEPARTMENT_OTHER): Payer: 59

## 2017-01-27 ENCOUNTER — Other Ambulatory Visit: Payer: Self-pay | Admitting: General Surgery

## 2017-01-27 ENCOUNTER — Encounter: Payer: Self-pay | Admitting: Oncology

## 2017-01-27 ENCOUNTER — Ambulatory Visit (HOSPITAL_BASED_OUTPATIENT_CLINIC_OR_DEPARTMENT_OTHER): Payer: 59 | Admitting: Oncology

## 2017-01-27 ENCOUNTER — Ambulatory Visit (HOSPITAL_COMMUNITY)
Admission: RE | Admit: 2017-01-27 | Discharge: 2017-01-27 | Disposition: A | Discharge status: Home / Self Care | Payer: 59 | Source: Ambulatory Visit | Attending: Oncology | Admitting: Oncology

## 2017-01-27 VITALS — BP 157/83 | HR 69 | Temp 97.7°F | Resp 18 | Ht 63.0 in | Wt 106.8 lb

## 2017-01-27 DIAGNOSIS — Z510 Encounter for antineoplastic radiation therapy: Secondary | ICD-10-CM | POA: Insufficient documentation

## 2017-01-27 DIAGNOSIS — Z170 Estrogen receptor positive status [ER+]: Secondary | ICD-10-CM | POA: Diagnosis not present

## 2017-01-27 DIAGNOSIS — D0512 Intraductal carcinoma in situ of left breast: Secondary | ICD-10-CM | POA: Diagnosis not present

## 2017-01-27 DIAGNOSIS — C50512 Malignant neoplasm of lower-outer quadrant of left female breast: Secondary | ICD-10-CM

## 2017-01-27 DIAGNOSIS — Z720 Tobacco use: Secondary | ICD-10-CM | POA: Diagnosis not present

## 2017-01-27 DIAGNOSIS — I10 Essential (primary) hypertension: Secondary | ICD-10-CM | POA: Insufficient documentation

## 2017-01-27 DIAGNOSIS — M4802 Spinal stenosis, cervical region: Secondary | ICD-10-CM | POA: Diagnosis not present

## 2017-01-27 DIAGNOSIS — N6489 Other specified disorders of breast: Secondary | ICD-10-CM | POA: Diagnosis not present

## 2017-01-27 DIAGNOSIS — D0511 Intraductal carcinoma in situ of right breast: Secondary | ICD-10-CM

## 2017-01-27 DIAGNOSIS — F419 Anxiety disorder, unspecified: Secondary | ICD-10-CM | POA: Insufficient documentation

## 2017-01-27 DIAGNOSIS — E739 Lactose intolerance, unspecified: Secondary | ICD-10-CM | POA: Insufficient documentation

## 2017-01-27 DIAGNOSIS — Z79899 Other long term (current) drug therapy: Secondary | ICD-10-CM | POA: Insufficient documentation

## 2017-01-27 DIAGNOSIS — Z803 Family history of malignant neoplasm of breast: Secondary | ICD-10-CM

## 2017-01-27 DIAGNOSIS — F17210 Nicotine dependence, cigarettes, uncomplicated: Secondary | ICD-10-CM | POA: Insufficient documentation

## 2017-01-27 DIAGNOSIS — K5909 Other constipation: Secondary | ICD-10-CM | POA: Insufficient documentation

## 2017-01-27 LAB — CBC WITH DIFFERENTIAL/PLATELET
BASO%: 0.6 % (ref 0.0–2.0)
Basophils Absolute: 0 10*3/uL (ref 0.0–0.1)
EOS ABS: 0.2 10*3/uL (ref 0.0–0.5)
EOS%: 2.9 % (ref 0.0–7.0)
HCT: 41.9 % (ref 34.8–46.6)
HGB: 14.7 g/dL (ref 11.6–15.9)
LYMPH%: 33.6 % (ref 14.0–49.7)
MCH: 34 pg (ref 25.1–34.0)
MCHC: 35.2 g/dL (ref 31.5–36.0)
MCV: 96.8 fL (ref 79.5–101.0)
MONO#: 0.5 10*3/uL (ref 0.1–0.9)
MONO%: 6.7 % (ref 0.0–14.0)
NEUT#: 3.9 10*3/uL (ref 1.5–6.5)
NEUT%: 56.2 % (ref 38.4–76.8)
PLATELETS: 233 10*3/uL (ref 145–400)
RBC: 4.33 10*6/uL (ref 3.70–5.45)
RDW: 12.5 % (ref 11.2–14.5)
WBC: 6.9 10*3/uL (ref 3.9–10.3)
lymph#: 2.3 10*3/uL (ref 0.9–3.3)

## 2017-01-27 LAB — COMPREHENSIVE METABOLIC PANEL
ALT: 21 U/L (ref 0–55)
AST: 24 U/L (ref 5–34)
Albumin: 4.4 g/dL (ref 3.5–5.0)
Alkaline Phosphatase: 70 U/L (ref 40–150)
Anion Gap: 10 mEq/L (ref 3–11)
BUN: 9.1 mg/dL (ref 7.0–26.0)
CO2: 30 mEq/L — ABNORMAL HIGH (ref 22–29)
Calcium: 10.8 mg/dL — ABNORMAL HIGH (ref 8.4–10.4)
Chloride: 103 mEq/L (ref 98–109)
Creatinine: 0.8 mg/dL (ref 0.6–1.1)
EGFR: 87 mL/min/{1.73_m2} — ABNORMAL LOW (ref >90)
Glucose: 86 mg/dl (ref 70–140)
Potassium: 4.1 mEq/L (ref 3.5–5.1)
Sodium: 143 mEq/L (ref 136–145)
Total Bilirubin: 0.51 mg/dL (ref 0.20–1.20)
Total Protein: 7.4 g/dL (ref 6.4–8.3)

## 2017-01-27 NOTE — Progress Notes (Signed)
[place]  Telephone:([phone]) [phone] Fax:(336) [phone]     ID: Toni Bowers DOB: 25-Mar-1966  MR#: [id number]  NIO#:[id number]  Patient Care Team: Darcus Austin, MD as PCP - General (Family Medicine) Rolm Bookbinder, MD as Consulting Physician (General Surgery) Chauncey Cruel, MD as Consulting Physician (Oncology) Kyung Rudd, MD as Consulting Physician (Radiation Oncology) Devra Dopp, MD as Referring Physician (Dermatology) Chauncey Cruel, MD OTHER MD:  CHIEF COMPLAINT: Ductal carcinoma in situ  CURRENT TREATMENT: Awaiting definitive treatment   BREAST CANCER HISTORY: Toni Bowers had routine bilateral screening mammography with tomography at the [facility] 01/05/2017. The breast density was category D. The patient has retropectoral implants in place. There was a possible mass or area of distortion in the right breast and on 01/12/2017 the patient underwent right diagnostic mammography with tomography and ultrasonography. This time the breast density was read as category C.   In the upper outer right breast there was an area of persistent distortion. This was not palpable. This area was sonographically silent and there was no right axillary adenopathy.  In the left breast however there were 2 separate groups of calcifications 1 measuring 0.4 cm in the upper outer quadrant and one measuring 1.0 cm in the lateral left breast.  On 01/19/2017 the patient underwent biopsy of the suspicious area in the upper-outer quadrant of the right breast and this showed ([id number]) a complex sclerosing lesion. On the same day in the left breast the patient had biopsies of the 2 areas of calcification of concern. The one in the upper-outer quadrant was benign. The one in the lower outer quadrant showed ductal carcinoma in situ, high-grade, estrogen receptor positive at 95%, and progesterone receptor positive at 70%, both with strong staining intensity.  Her subsequent  history is as detailed below   INTERVAL HISTORY: Toni Bowers was evaluated in the multidisciplinary breast cancer clinic 01/27/2017 accompanied by her husband Toni Bowers. Her case was also presented in the multidisciplinary breast cancer conference that same morning. At that time a preliminary plan was proposed: Breast conserving surgery possibly with sentinel lymph node sampling on the left, excisional biopsy on the right. This would be followed by radiation and consideration of anti-estrogens,  REVIEW OF SYSTEMS: There were no specific symptoms leading to the original mammogram, which was routinely scheduled. The patient denies unusual headaches, visual changes, nausea, vomiting, stiff neck, dizziness, or gait imbalance. There has been no cough, phlegm production, or pleurisy, no chest pain or pressure, and no change in bowel or bladder habits. The patient denies fever, rash, bleeding, unexplained fatigue or unexplained weight loss. A detailed review of systems was otherwise entirely negative.   PAST MEDICAL HISTORY: Past Medical History:  Diagnosis Date  . Anxiety   . Chronic constipation   . Hypertension   . Internal prolapsed hemorrhoids    and bleeding  . Lactose intolerance   . Wears glasses     PAST SURGICAL HISTORY: Past Surgical History:  Procedure Laterality Date  . AUGMENTATION MAMMAPLASTY Bilateral 1999   retro-pectoral saline  . BREAST IMPLANT EXCHANGE  01/  2016  . HEMORRHOID SURGERY N/A 10/17/2015   Procedure: HEMORRHOIDECTOMY;  Surgeon: Jackolyn Confer, MD;  Location: [facility];  Service: General;  Laterality: N/A;  . PLACEMENT OF BREAST IMPLANTS Bilateral 1999    FAMILY HISTORY Family History  Problem Relation Age of Onset  . Breast cancer Mother   The patient's mother, Toni Bowers, died from breast cancer, recurrent, in 2013 at age 48.  The patient's father is still living at age 69 as of May 2018. The patient has one brother, no sisters. There is no  other history of breast or ovarian cancer in the family.  GYNECOLOGIC HISTORY:  No LMP recorded. Menarche age 17, the patient is GX P0. She stopped having periods October 2017 but then they resumed March 2018 and came right on time in April 2018.  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 Her husband Toni Bowers runs the [employer] out of [place]. At home is just the 2 of them plus 2 chihuahuas    ADVANCED DIRECTIVES: In place   HEALTH MAINTENANCE: Social History  Substance Use Topics  . Smoking status: Current Every Day Smoker    Packs/day: 1.00    Years: 30.00    Types: Cigarettes  . Smokeless tobacco: Never Used  . Alcohol use No     Colonoscopy: October 2016  PAP:  Bone density: 2004?   Allergies  Allergen Reactions  . Codeine Nausea And Vomiting  . Metoprolol Succinate [Metoprolol] Swelling    Throat swelling  . Zoloft [Sertraline Hcl] Swelling    "throat closes"  . Adhesive [Tape] Itching, Swelling and Rash    Current Outpatient Prescriptions  Medication Sig Dispense Refill  . azelastine (OPTIVAR) 0.05 % ophthalmic solution 1 drop 2 (two) times daily.    . Biotin 5000 MCG CAPS Take 1 capsule by mouth every morning.    . busPIRone (BUSPAR) 10 MG tablet Take 10 mg by mouth daily.     . Calcium Carbonate-Vitamin D (CALCIUM 600+D) 600-400 MG-UNIT tablet Take 1 tablet by mouth daily.    Marland Kitchen ibuprofen (ADVIL,MOTRIN) 200 MG tablet Take 800 mg by mouth every 6 (six) hours as needed.    Marland Kitchen lisinopril (PRINIVIL,ZESTRIL) 5 MG tablet Take 5 mg by mouth daily.    . Multiple Vitamin (MULTIVITAMIN) tablet Take 1 tablet by mouth daily.    . Omega-3 Fatty Acids (FISH OIL) 1200 MG CAPS Take 1 capsule by mouth daily.    . polycarbophil (FIBERCON) 625 MG tablet Take 625 mg by mouth 2 (two) times daily.    . vitamin B-12 (CYANOCOBALAMIN) 1000 MCG tablet Take 1,000 mcg by mouth daily.    . ondansetron (ZOFRAN) 4 MG tablet Take 1 tablet  (4 mg total) by mouth every 4 (four) hours as needed for nausea. (Patient not taking: Reported on 01/27/2017) 30 tablet 0  . oxyCODONE (OXY IR/ROXICODONE) 5 MG immediate release tablet Take 1-2 tablets (5-10 mg total) by mouth every 4 (four) hours as needed for moderate pain, severe pain or breakthrough pain. (Patient not taking: Reported on 01/27/2017) 40 tablet 0   No current facility-administered medications for this visit.     OBJECTIVE: Middle-aged white woman in no acute distress  Vitals:   01/27/17 0907  BP: (!) 157/83  Pulse: 69  Resp: 18  Temp: 97.7 F (36.5 C)     Body mass index is 18.92 kg/m.    ECOG FS:0 - Asymptomatic  Ocular: Sclerae unicteric, pupils equal, round and reactive to light Ear-nose-throat: Oropharynx clear and moist Lymphatic: No cervical or supraclavicular adenopathy Lungs no rales or rhonchi, good excursion bilaterally Heart regular rate and rhythm, no murmur appreciated Abd soft, nontender, positive bowel sounds MSK no focal spinal tenderness, no joint edema Neuro: non-focal, well-oriented, appropriate affect Breasts: She has bilateral subpectoral implants in place. She is status post bilateral biopsies and has bilateral ecchymoses. There  are no skin or nipple findings of concern otherwise. Both axillae are benign.   LAB RESULTS:  CMP     Component Value Date/Time   NA 143 01/27/2017 0852   K 4.1 01/27/2017 0852   CL 102 10/14/2015 1258   CO2 30 (H) 01/27/2017 0852   GLUCOSE 86 01/27/2017 0852   BUN 9.1 01/27/2017 0852   CREATININE 0.8 01/27/2017 0852   CALCIUM 10.8 (H) 01/27/2017 0852   PROT 7.4 01/27/2017 0852   ALBUMIN 4.4 01/27/2017 0852   AST 24 01/27/2017 0852   ALT 21 01/27/2017 0852   ALKPHOS 70 01/27/2017 0852   BILITOT 0.51 01/27/2017 0852   GFRNONAA >60 10/14/2015 1258   GFRAA >60 10/14/2015 1258    No results found for: TOTALPROTELP, ALBUMINELP, A1GS, A2GS, BETS, BETA2SER, GAMS, MSPIKE, SPEI  No results found for:  Nils Pyle, [facility]  Lab Results  Component Value Date   WBC 6.9 01/27/2017   NEUTROABS 3.9 01/27/2017   HGB 14.7 01/27/2017   HCT 41.9 01/27/2017   MCV 96.8 01/27/2017   PLT 233 01/27/2017      Chemistry      Component Value Date/Time   NA 143 01/27/2017 0852   K 4.1 01/27/2017 0852   CL 102 10/14/2015 1258   CO2 30 (H) 01/27/2017 0852   BUN 9.1 01/27/2017 0852   CREATININE 0.8 01/27/2017 0852      Component Value Date/Time   CALCIUM 10.8 (H) 01/27/2017 0852   ALKPHOS 70 01/27/2017 0852   AST 24 01/27/2017 0852   ALT 21 01/27/2017 0852   BILITOT 0.51 01/27/2017 0852       No results found for: LABCA2  No components found for: [id number]  No results for input(s): INR in the last 168 hours.  Urinalysis No results found for: COLORURINE, APPEARANCEUR, LABSPEC, PHURINE, GLUCOSEU, HGBUR, BILIRUBINUR, KETONESUR, PROTEINUR, UROBILINOGEN, NITRITE, LEUKOCYTESUR   STUDIES: Dg Cervical Spine Complete  Result Date: 01/27/2017 CLINICAL DATA:  Left neck pressure for 6 months. EXAM: CERVICAL SPINE - COMPLETE 4+ VIEW COMPARISON:  None. FINDINGS: Mild degenerative disc disease changes with disc space narrowing and early anterior spurring at C4-5 and C5-6. Normal alignment. No fracture. Prevertebral soft tissues are normal. IMPRESSION: Early degenerative disc disease changes.  No acute findings. Electronically Signed   By: Rolm Baptise M.D.   On: 01/27/2017 12:33   US Breast Ltd Uni Right Inc Axilla  Result Date: 01/12/2017 CLINICAL DATA:  Patient recalled from screening for left breast calcifications and right breast distortion. EXAM: 2D DIGITAL DIAGNOSTIC BILATERAL MAMMOGRAM WITH IMPLANTS, CAD AND ADJUNCT TOMO ULTRASOUND RIGHT BREAST The patient has retropectoral implants. Standard and implant displaced views were performed. COMPARISON:  Previous exam(s). ACR Breast Density Category c: The breast tissue is heterogeneously dense, which may obscure small masses.  FINDINGS: Persistent distortion demonstrated within the upper-outer right breast posterior depth, further evaluated with spot tomosynthesis images. Magnification CC and true lateral views of the left breast were obtained demonstrating 2 separate groups of calcifications. Within the upper-outer left breast middle depth there is a 4 mm group of amorphous calcifications. Within the lateral left breast slightly inferiorly and posteriorly there is a 10 mm group of coarse heterogeneous calcifications. On physical exam, I palpate no discrete mass within the lateral right breast. Targeted ultrasound is performed, showing no right axillary adenopathy. No discrete sonographic correlate identified for the distortion within the lateral right breast. Mammographic images were processed with CAD. IMPRESSION: Suspicious persistent right breast distortion. Two suspicious groups of  calcifications within the left breast as described above. RECOMMENDATION: Stereotactic guided core needle biopsy right breast distortion. Stereotactic guided core needle biopsy calcifications within the lateral posterior left breast. Stereotactic guided core needle biopsy amorphous calcifications upper-outer left breast. I have discussed the findings and recommendations with the patient. Results were also provided in writing at the conclusion of the visit. If applicable, a reminder letter will be sent to the patient regarding the next appointment. BI-RADS CATEGORY  4: Suspicious. Electronically Signed   By: Lovey Newcomer M.D.   On: 01/12/2017 16:39   Mm Diag Breast Tomo Bilateral  Result Date: 01/12/2017 CLINICAL DATA:  Patient recalled from screening for left breast calcifications and right breast distortion. EXAM: 2D DIGITAL DIAGNOSTIC BILATERAL MAMMOGRAM WITH IMPLANTS, CAD AND ADJUNCT TOMO ULTRASOUND RIGHT BREAST The patient has retropectoral implants. Standard and implant displaced views were performed. COMPARISON:  Previous exam(s). ACR Breast  Density Category c: The breast tissue is heterogeneously dense, which may obscure small masses. FINDINGS: Persistent distortion demonstrated within the upper-outer right breast posterior depth, further evaluated with spot tomosynthesis images. Magnification CC and true lateral views of the left breast were obtained demonstrating 2 separate groups of calcifications. Within the upper-outer left breast middle depth there is a 4 mm group of amorphous calcifications. Within the lateral left breast slightly inferiorly and posteriorly there is a 10 mm group of coarse heterogeneous calcifications. On physical exam, I palpate no discrete mass within the lateral right breast. Targeted ultrasound is performed, showing no right axillary adenopathy. No discrete sonographic correlate identified for the distortion within the lateral right breast. Mammographic images were processed with CAD. IMPRESSION: Suspicious persistent right breast distortion. Two suspicious groups of calcifications within the left breast as described above. RECOMMENDATION: Stereotactic guided core needle biopsy right breast distortion. Stereotactic guided core needle biopsy calcifications within the lateral posterior left breast. Stereotactic guided core needle biopsy amorphous calcifications upper-outer left breast. I have discussed the findings and recommendations with the patient. Results were also provided in writing at the conclusion of the visit. If applicable, a reminder letter will be sent to the patient regarding the next appointment. BI-RADS CATEGORY  4: Suspicious. Electronically Signed   By: Lovey Newcomer M.D.   On: 01/12/2017 16:39   Mm Screening Breast W/implant Tomo Bilateral  Result Date: 01/05/2017 CLINICAL DATA:  Screening. EXAM: 2D DIGITAL SCREENING BILATERAL MAMMOGRAM WITH IMPLANTS, CAD AND ADJUNCT TOMO The patient has retropectoral implants. Standard and implant displaced views were performed. COMPARISON:  Previous exam(s). ACR Breast  Density Category d: The breast tissue is extremely dense, which lowers the sensitivity of mammography. FINDINGS: In the right breast possible distortion requires further evaluation. In the left breast calcifications require further evaluation. Images were processed with CAD. IMPRESSION: Further evaluation is suggested for possible distortion in the right breast. Further evaluation is suggested for possible calcifications in the left breast. The patient will be contacted regarding the findings, and additional imaging will be scheduled. RECOMMENDATION: Diagnostic mammogram and possibly ultrasound of both breasts. (Code:FI-B-29M) BI-RADS CATEGORY  0: Incomplete. Need additional imaging evaluation and/or prior mammograms for comparison. Electronically Signed   By: Ammie Ferrier M.D.   On: 01/05/2017 08:50   Mm Clip Placement Left  Result Date: 01/19/2017 CLINICAL DATA:  Confirmation of clip placement after stereotactic tomosynthesis core needle biopsy of 2 separate groups of calcifications in the left breast, loosely grouped calcifications in the lower outer quadrant spanning approximately 1.5 cm and a 4 mm group of amorphous calcifications in the  upper outer quadrant. EXAM: DIAGNOSTIC LEFT MAMMOGRAM POST STEREOTACTIC BIOPSY COMPARISON:  Previous exam(s). FINDINGS: Mammographic images were obtained following stereotactic tomosynthesis guided biopsy of 2 groups of calcifications in the left breast. The coil shaped tissue marker clip is appropriately positioned at the site of the biopsied group of calcifications in the lower outer quadrant of the left breast at posterior depth. Calcifications from the inferomedial portion of the group were sampled and the clip is positioned at the site of the biopsied calcifications. The X shaped tissue marker clip is appropriately positioned at the site of the biopsied faint amorphous calcifications in the upper outer quadrant of the left breast at posterior depth. Expected post  biopsy changes are present at both sites without evidence of hematoma. IMPRESSION: 1. Appropriate positioning of the coil shaped tissue marker clip at the site of the biopsy group of calcifications in the lower outer quadrant of the left breast. 2. Appropriate positioning of the X shaped tissue marker clip at the site of the biopsied faint amorphous calcifications in the upper outer quadrant of the left breast. Final Assessment: Post Procedure Mammograms for Marker Placement Electronically Signed   By: Evangeline Dakin M.D.   On: 01/19/2017 12:56   Mm Clip Placement Right  Result Date: 01/19/2017 CLINICAL DATA:  Confirmation of clip placement after stereotactic tomosynthesis core needle biopsy of architectural distortion involving the upper outer quadrant of the right breast. EXAM: 3D DIAGNOSTIC RIGHT MAMMOGRAM POST STEREOTACTIC BIOPSY COMPARISON:  Previous exam(s). FINDINGS: Standard and tomosynthesis mammographic CC and mediolateral images were obtained following stereotactic tomosynthesis guided biopsy of architectural distortion involving the upper outer quadrant of the right breast. The coil shaped tissue marker clip is appropriately positioned at the site of the biopsied distortion. Expected post biopsy changes are present without evidence of hematoma. IMPRESSION: Appropriate positioning of the coil shaped tissue marker clip at the site of the biopsied architectural distortion in the upper outer quadrant of the right breast. Final Assessment: Post Procedure Mammograms for Marker Placement Electronically Signed   By: Evangeline Dakin M.D.   On: 01/19/2017 12:54   Mm Lt Breast Bx W Loc Dev 1st Lesion Image Bx Spec Stereo Guide  Addendum Date: 01/22/2017   ADDENDUM REPORT: 01/22/2017 08:16 ADDENDUM: Pathology revealed HIGH GRADE DUCTAL CARCINOMA IN SITU WITH CALCIFICATIONS of the LEFT breast, lower outer quadrant. BENIGN SOFT TISSUE AND BLOOD of the LEFT breast, upper outer quadrant. This was found to be  concordant by Dr. Peggye Fothergill. Pathology revealed COMPLEX SCLEROSING LESION WITH CALCIFICATIONS, FIBROCYSTIC CHANGE AND ADENOSIS WITH CALCIFICATIONS of the RIGHT breast, upper outer quadrant. This was found to be concordant by Dr. Peggye Fothergill, with excision recommended. Pathology results were discussed with the patient by telephone. The patient reported doing well after the biopsies with tenderness and minimal bleeding at the sites. Post biopsy instructions and care were reviewed and questions were answered. The patient was encouraged to call [facility] for any additional concerns. The patient was referred to [facility] at [facility] on Jan 27, 2017. Preoperative MRI is recommended due to the patient's extreme breast density, the indwelling implants, the fact that there are calcifications elsewhere in the left breast separate from the biopsied calcifications, and the fact that there is a high risk lesion in the right breast. MRI would be useful to confirm extent of disease in both breasts. Pathology results reported by Terie Purser, RN on 01/22/2017. Electronically Signed  By: Evangeline Dakin M.D.   On: 01/22/2017 08:16   Result Date: 01/22/2017 CLINICAL DATA:  Screening detected indeterminate loose group of calcifications spanning approximately 1.5 cm involving the lower outer quadrant of the left breast at posterior depth and a 4 mm group of indeterminate amorphous calcifications involving the upper outer quadrant of the left breast at posterior depth. The patient also had a stereotactic biopsy of architectural distortion in the right breast which is reported separately. EXAM: LEFT BREAST STEREOTACTIC CORE NEEDLE BIOPSY x 2 COMPARISON:  Previous exams. FINDINGS: The patient and I discussed the procedure of stereotactic-guided biopsy including benefits and alternatives. We discussed the high likelihood of a  successful procedure. We discussed the risks of the procedure including infection, bleeding, tissue injury, clip migration, and inadequate sampling. Informed written consent was given. The usual time out protocol was performed immediately prior to the procedure. Using sterile technique with chlorhexidine as skin antisepsis, 1% lidocaine and 1% lidocaine with epinephrine as local anesthetic, under stereotactic guidance, a 9 gauge Brevera vacuum assisted device was used to perform core needle biopsy of calcifications in the lower outer quadrant of the left breast at posterior depth using a lateral approach. Specimen radiograph was performed showing calcifications in 2 or 3 of the core samples. Specimens with calcifications are identified for pathology. Lesion # 1 quadrant: Lower outer quadrant. At the conclusion of the procedure, a coil shaped tissue marker clip was deployed into the biopsy cavity. Using a similar technique as above, a 9 gauge Brevera vacuum assisted device was used to perform core needle biopsy of the amorphous calcifications in the upper outer quadrant of the left breast using a lateral approach. Specimen radiograph was performed showing faint calcifications in 2 of the core samples. Specimens with calcifications are identified for pathology. Lesion # 2 quadrant:  Upper outer quadrant. At the conclusion of the procedure, an X shaped tissue marker clip was deployed into the biopsy cavity. Follow-up 2-view mammogram was performed and dictated separately. IMPRESSION: Stereotactic-guided biopsy of 2 separate groups of calcifications in the lower outer quadrant and the upper outer quadrant of the left breast. No apparent complications. Electronically Signed: By: Evangeline Dakin M.D. On: 01/19/2017 12:37   Mm Lt Breast Bx W Loc Dev Ea Ad Lesion Img Bx Spec Stereo Guide  Addendum Date: 01/22/2017   ADDENDUM REPORT: 01/22/2017 08:16 ADDENDUM: Pathology revealed HIGH GRADE DUCTAL CARCINOMA IN SITU WITH  CALCIFICATIONS of the LEFT breast, lower outer quadrant. BENIGN SOFT TISSUE AND BLOOD of the LEFT breast, upper outer quadrant. This was found to be concordant by Dr. Peggye Fothergill. Pathology revealed COMPLEX SCLEROSING LESION WITH CALCIFICATIONS, FIBROCYSTIC CHANGE AND ADENOSIS WITH CALCIFICATIONS of the RIGHT breast, upper outer quadrant. This was found to be concordant by Dr. Peggye Fothergill, with excision recommended. Pathology results were discussed with the patient by telephone. The patient reported doing well after the biopsies with tenderness and minimal bleeding at the sites. Post biopsy instructions and care were reviewed and questions were answered. The patient was encouraged to call [facility] for any additional concerns. The patient was referred to [facility] on Jan 27, 2017. Preoperative MRI is recommended due to the patient's extreme breast density, the indwelling implants, the fact that there are calcifications elsewhere in the left breast separate from the biopsied calcifications, and the fact that there is a high risk lesion in the right breast. MRI would be useful  to confirm extent of disease in both breasts. Pathology results reported by Terie Purser, RN on 01/22/2017. Electronically Signed   By: Evangeline Dakin M.D.   On: 01/22/2017 08:16   Result Date: 01/22/2017 CLINICAL DATA:  Screening detected indeterminate loose group of calcifications spanning approximately 1.5 cm involving the lower outer quadrant of the left breast at posterior depth and a 4 mm group of indeterminate amorphous calcifications involving the upper outer quadrant of the left breast at posterior depth. The patient also had a stereotactic biopsy of architectural distortion in the right breast which is reported separately. EXAM: LEFT BREAST STEREOTACTIC CORE NEEDLE BIOPSY x 2 COMPARISON:  Previous exams. FINDINGS: The  patient and I discussed the procedure of stereotactic-guided biopsy including benefits and alternatives. We discussed the high likelihood of a successful procedure. We discussed the risks of the procedure including infection, bleeding, tissue injury, clip migration, and inadequate sampling. Informed written consent was given. The usual time out protocol was performed immediately prior to the procedure. Using sterile technique with chlorhexidine as skin antisepsis, 1% lidocaine and 1% lidocaine with epinephrine as local anesthetic, under stereotactic guidance, a 9 gauge Brevera vacuum assisted device was used to perform core needle biopsy of calcifications in the lower outer quadrant of the left breast at posterior depth using a lateral approach. Specimen radiograph was performed showing calcifications in 2 or 3 of the core samples. Specimens with calcifications are identified for pathology. Lesion # 1 quadrant: Lower outer quadrant. At the conclusion of the procedure, a coil shaped tissue marker clip was deployed into the biopsy cavity. Using a similar technique as above, a 9 gauge Brevera vacuum assisted device was used to perform core needle biopsy of the amorphous calcifications in the upper outer quadrant of the left breast using a lateral approach. Specimen radiograph was performed showing faint calcifications in 2 of the core samples. Specimens with calcifications are identified for pathology. Lesion # 2 quadrant:  Upper outer quadrant. At the conclusion of the procedure, an X shaped tissue marker clip was deployed into the biopsy cavity. Follow-up 2-view mammogram was performed and dictated separately. IMPRESSION: Stereotactic-guided biopsy of 2 separate groups of calcifications in the lower outer quadrant and the upper outer quadrant of the left breast. No apparent complications. Electronically Signed: By: Evangeline Dakin M.D. On: 01/19/2017 12:37   Mm Rt Breast Bx W Loc Dev 1st Lesion Image Bx Spec  Stereo Guide  Addendum Date: 01/22/2017   ADDENDUM REPORT: 01/22/2017 07:01 ADDENDUM: Pathology revealed HIGH GRADE DUCTAL CARCINOMA IN SITU WITH CALCIFICATIONS of the LEFT breast, lower outer quadrant. BENIGN SOFT TISSUE AND BLOOD of the LEFT breast, upper outer quadrant. This was found to be concordant by Dr. Peggye Fothergill. Pathology revealed COMPLEX SCLEROSING LESION WITH CALCIFICATIONS, FIBROCYSTIC CHANGE AND ADENOSIS WITH CALCIFICATIONS of the RIGHT breast, upper outer quadrant. This was found to be concordant by Dr. Peggye Fothergill, with excision recommended. Pathology results were discussed with the patient by telephone. The patient reported doing well after the biopsies with tenderness and minimal bleeding at the sites. Post biopsy instructions and care were reviewed and questions were answered. The patient was encouraged to call [facility] for any additional concerns. The patient was referred to [facility] on Jan 27, 2017. Preoperative MRI is recommended due to the patient's extreme breast density, the indwelling implants, the fact that there are calcifications elsewhere in the left breast separate from the  biopsied calcifications, and the fact that there is a high risk lesion in the right breast. MRI would be useful to confirm extent of disease in both breasts. Pathology results reported by Terie Purser, RN on 01/22/2017. Electronically Signed   By: Evangeline Dakin M.D.   On: 01/22/2017 07:01   Result Date: 01/22/2017 CLINICAL DATA:  51 year old indwelling bilateral saline implants, presenting with screening detected architectural distortion involving the upper outer quadrant of the right breast. She subsequently underwent stereotactic core needle biopsy of 2 groups of calcifications in the left breast, and these procedures are reported separately. EXAM: RIGHT BREAST STEREOTACTIC CORE NEEDLE BIOPSY  COMPARISON:  Previous exams. FINDINGS: The patient and I discussed the procedure of stereotactic-guided biopsy including benefits and alternatives. We discussed the high likelihood of a successful procedure. We discussed the risks of the procedure including infection, bleeding, tissue injury, clip migration, and inadequate sampling. Informed written consent was given. The usual time out protocol was performed immediately prior to the procedure. Using sterile technique with chlorhexidine as skin antisepsis, 1% lidocaine and 1% lidocaine with epinephrine as local anesthetic, under stereotactic guidance, a 9 gauge vacuum assisted device was used to perform core needle biopsy of the architectural distortion involving the upper outer quadrant of the right breast using a superior approach. Specimen radiograph was performed showing soft tissue density in the specimens. There are no associated calcifications. Lesion quadrant: Upper outer quadrant. At the conclusion of the procedure, a coil shaped tissue marker clip was deployed into the biopsy cavity. Follow-up 2-view mammogram was performed and dictated separately. IMPRESSION: Stereotactic-guided biopsy of architectural distortion involving the upper outer quadrant of the right breast. No apparent complications. Electronically Signed: By: Evangeline Dakin M.D. On: 01/19/2017 12:52    ELIGIBLE FOR AVAILABLE RESEARCH PROTOCOL: no  ASSESSMENT: 51 y.o. [place], [place] woman status post left breast lower outer quadrant biopsy 01/19/2017 for ductal carcinoma in situ, grade 3, estrogen and progesterone receptor positive  (1) right breast upper outer quadrant biopsy 01/19/2017 shows a complex sclerosing lesion  (2) definitive surgery pending  (3) adjuvant radiation as appropriate  (4) consider anti-estrogens at the completion of local therapy.  PLAN: We spent the better part of today's hour-long appointment discussing the biology of breast cancer in  general, and the specifics of the patient's tumor in particular. Cathlene understands that in noninvasive ductal carcinoma, also called ductal carcinoma in situ ("DCIS") the breast cancer cells remain trapped in the ducts were they started. They cannot travel to a vital organ. For that reason these cancers in themselves are not life-threatening.  If the whole breast is removed then all the ducts are removed and since the cancer cells are trapped in the ducts, the cure rate with mastectomy for noninvasive breast cancer is approximately 99%. Nevertheless we recommend lumpectomy, because there is no survival advantage to mastectomy and because the cosmetic result is generally superior with breast conservation.  If Toni Bowers keeps her breast, there will be some risk of recurrence. The recurrence can only be in the same breast since, again, the cells are trapped in the ducts. There is no connection from one breast to the other. The risk of local recurrence is cut by more than half with radiation, which is standard in this situation.  In estrogen receptor positive cancers like Toni Bowers's's, anti-estrogens can also be considered. They will further reduce the risk of recurrence by one half. In addition anti-estrogens will lower the risk of a new breast cancer developing in either breast,  also by one half. That risk approaches 1% per year. Anti-estrogens reduce it to 1/2%.  After this discussion, Toni Bowers feels strongly drawn to bilateral mastectomies. She understands there is no survival advantage, but she is very concerned about her having to undergo further biopsies and intensified screening in the upcoming years. She likes the idea of being completely done with surgery, with no further mammography, no radiation, no hormones. She will discuss this with her surgeon, Dr. Donne Hazel.  My plan is to see her after whatever surgery she decides on. If she does have bilateral mastectomies she will not require any further  treatment but may wish to be followed at least 4 year or 2 chiefly for reassurance. If she does keep her breast we will discuss anti-estrogens at that time.  Toni Bowers has a good understanding of her options. She knows the goal of treatment in her case is cure. She will call with any problems that may develop before her next visit here.  Chauncey Cruel, MD   01/28/2017 7:42 AM Medical Oncology and Hematology [facility] [address] [place], [place] [address] Tel. [phone]    Fax. [phone]

## 2017-01-27 NOTE — Addendum Note (Signed)
Encounter addended by: Hayden Pedro, PA-C on: 01/27/2017 11:34 AM<BR>    Actions taken: Sign clinical note

## 2017-01-27 NOTE — Progress Notes (Signed)
Nutrition Assessment  Reason for Assessment:  Pt seen in Breast Clinic  ASSESSMENT:   51 year old female with new diagnosis of breast cancer.  Past medical history of hemorrhoidectomy, breast implants.  Patient reports normal appetite and incorporates high fiber foods on regular basis.  Medications:  reviewed  Labs: reviewed  Anthropometrics:   Height: 63 inches Weight: 106 lb BMI: 19   NUTRITION DIAGNOSIS: Food and nutrition related knowledge deficit related to new diagnosis of breast cancer as evidenced by no prior need for nutrition related information.  INTERVENTION:   Discussed and provided packet of information regarding nutritional tips for breast cancer patients.  Questions answered.  Teachback method used.  Contact information provided and patient knows to contact me with questions/concerns.    MONITORING, EVALUATION, and GOAL: Pt will consume a healthy plant based diet to maintain lean body mass throughout treatment.   Keyle Doby B. Zenia Resides, [place], [place] Registered Dietitian [phone] (pager)

## 2017-02-01 ENCOUNTER — Telehealth: Payer: Self-pay | Admitting: *Deleted

## 2017-02-01 NOTE — Telephone Encounter (Signed)
Spoke to pt concerning [place] from 01/27/17. Pt denies questions or concerns regarding dx or treatment care plan. Encourage pt to call with needs. Received verbal understanding. Contact information provided.

## 2017-02-05 ENCOUNTER — Other Ambulatory Visit: Payer: Self-pay | Admitting: General Surgery

## 2017-02-05 DIAGNOSIS — N6489 Other specified disorders of breast: Secondary | ICD-10-CM

## 2017-02-08 ENCOUNTER — Encounter (HOSPITAL_BASED_OUTPATIENT_CLINIC_OR_DEPARTMENT_OTHER): Payer: Self-pay | Admitting: *Deleted

## 2017-02-08 DIAGNOSIS — K6289 Other specified diseases of anus and rectum: Secondary | ICD-10-CM | POA: Diagnosis not present

## 2017-02-09 DIAGNOSIS — D0512 Intraductal carcinoma in situ of left breast: Secondary | ICD-10-CM | POA: Diagnosis not present

## 2017-02-09 DIAGNOSIS — N6489 Other specified disorders of breast: Secondary | ICD-10-CM | POA: Diagnosis not present

## 2017-02-12 ENCOUNTER — Ambulatory Visit
Admission: RE | Admit: 2017-02-12 | Discharge: 2017-02-12 | Disposition: A | Discharge status: Home / Self Care | Payer: 59 | Source: Ambulatory Visit | Attending: General Surgery | Admitting: General Surgery

## 2017-02-12 ENCOUNTER — Other Ambulatory Visit: Payer: Self-pay | Admitting: General Surgery

## 2017-02-12 DIAGNOSIS — N6489 Other specified disorders of breast: Secondary | ICD-10-CM

## 2017-02-12 DIAGNOSIS — D0511 Intraductal carcinoma in situ of right breast: Secondary | ICD-10-CM

## 2017-02-12 DIAGNOSIS — D0512 Intraductal carcinoma in situ of left breast: Secondary | ICD-10-CM | POA: Diagnosis not present

## 2017-02-12 DIAGNOSIS — R921 Mammographic calcification found on diagnostic imaging of breast: Secondary | ICD-10-CM | POA: Diagnosis not present

## 2017-02-12 DIAGNOSIS — R928 Other abnormal and inconclusive findings on diagnostic imaging of breast: Secondary | ICD-10-CM | POA: Diagnosis not present

## 2017-02-15 ENCOUNTER — Encounter (HOSPITAL_BASED_OUTPATIENT_CLINIC_OR_DEPARTMENT_OTHER): Payer: Self-pay | Admitting: Anesthesiology

## 2017-02-15 ENCOUNTER — Ambulatory Visit (HOSPITAL_BASED_OUTPATIENT_CLINIC_OR_DEPARTMENT_OTHER): Payer: 59 | Admitting: Anesthesiology

## 2017-02-15 ENCOUNTER — Ambulatory Visit
Admit: 2017-02-15 | Discharge: 2017-02-15 | Disposition: A | Discharge status: Home / Self Care | Payer: 59 | Attending: General Surgery | Admitting: General Surgery

## 2017-02-15 ENCOUNTER — Ambulatory Visit
Admission: RE | Admit: 2017-02-15 | Discharge: 2017-02-15 | Disposition: A | Discharge status: Home / Self Care | Payer: 59 | Source: Ambulatory Visit | Attending: General Surgery | Admitting: General Surgery

## 2017-02-15 ENCOUNTER — Ambulatory Visit (HOSPITAL_BASED_OUTPATIENT_CLINIC_OR_DEPARTMENT_OTHER)
Admission: RE | Admit: 2017-02-15 | Discharge: 2017-02-15 | Disposition: A | Discharge status: Home / Self Care | Payer: 59 | Source: Ambulatory Visit | Attending: General Surgery | Admitting: General Surgery

## 2017-02-15 ENCOUNTER — Encounter (HOSPITAL_BASED_OUTPATIENT_CLINIC_OR_DEPARTMENT_OTHER)
Admission: RE | Disposition: A | Discharge status: Home / Self Care | Payer: Self-pay | Source: Ambulatory Visit | Attending: General Surgery

## 2017-02-15 DIAGNOSIS — D0512 Intraductal carcinoma in situ of left breast: Secondary | ICD-10-CM | POA: Diagnosis present

## 2017-02-15 DIAGNOSIS — N6011 Diffuse cystic mastopathy of right breast: Secondary | ICD-10-CM | POA: Insufficient documentation

## 2017-02-15 DIAGNOSIS — R928 Other abnormal and inconclusive findings on diagnostic imaging of breast: Secondary | ICD-10-CM | POA: Diagnosis not present

## 2017-02-15 DIAGNOSIS — F17200 Nicotine dependence, unspecified, uncomplicated: Secondary | ICD-10-CM | POA: Diagnosis not present

## 2017-02-15 DIAGNOSIS — N6081 Other benign mammary dysplasias of right breast: Secondary | ICD-10-CM | POA: Diagnosis not present

## 2017-02-15 DIAGNOSIS — Z9886 Personal history of breast implant removal: Secondary | ICD-10-CM | POA: Insufficient documentation

## 2017-02-15 DIAGNOSIS — R921 Mammographic calcification found on diagnostic imaging of breast: Secondary | ICD-10-CM | POA: Diagnosis not present

## 2017-02-15 DIAGNOSIS — N6012 Diffuse cystic mastopathy of left breast: Secondary | ICD-10-CM | POA: Diagnosis not present

## 2017-02-15 DIAGNOSIS — F419 Anxiety disorder, unspecified: Secondary | ICD-10-CM | POA: Insufficient documentation

## 2017-02-15 DIAGNOSIS — N6489 Other specified disorders of breast: Secondary | ICD-10-CM | POA: Diagnosis not present

## 2017-02-15 DIAGNOSIS — Z9889 Other specified postprocedural states: Secondary | ICD-10-CM | POA: Insufficient documentation

## 2017-02-15 DIAGNOSIS — Z79891 Long term (current) use of opiate analgesic: Secondary | ICD-10-CM | POA: Insufficient documentation

## 2017-02-15 DIAGNOSIS — Z79899 Other long term (current) drug therapy: Secondary | ICD-10-CM | POA: Insufficient documentation

## 2017-02-15 DIAGNOSIS — N6320 Unspecified lump in the left breast, unspecified quadrant: Secondary | ICD-10-CM

## 2017-02-15 HISTORY — PX: BREAST LUMPECTOMY WITH RADIOACTIVE SEED LOCALIZATION: SHX6424

## 2017-02-15 HISTORY — PX: RADIOACTIVE SEED GUIDED EXCISIONAL BREAST BIOPSY: SHX6490

## 2017-02-15 SURGERY — RADIOACTIVE SEED GUIDED BREAST BIOPSY
Anesthesia: General | Site: Breast | Laterality: Right

## 2017-02-15 MED ORDER — BUPIVACAINE HCL (PF) 0.25 % IJ SOLN
INTRAMUSCULAR | Status: DC | PRN
  Administered 2017-02-15: 14 mL

## 2017-02-15 MED ORDER — ONDANSETRON HCL 4 MG/2ML IJ SOLN
INTRAMUSCULAR | Status: DC | PRN
  Administered 2017-02-15: 4 mg via INTRAVENOUS

## 2017-02-15 MED ORDER — CEFAZOLIN SODIUM-DEXTROSE 2-4 GM/100ML-% IV SOLN
2.0000 g | INTRAVENOUS | Status: AC
  Administered 2017-02-15: 2 g via INTRAVENOUS

## 2017-02-15 MED ORDER — TRAMADOL HCL 50 MG PO TABS: 50.0000 mg | ORAL_TABLET | Freq: Four times a day (QID) | ORAL | 1 refills | Status: DC | PRN

## 2017-02-15 MED ORDER — ARTIFICIAL TEARS OPHTHALMIC OINT
TOPICAL_OINTMENT | OPHTHALMIC | Status: AC
  Filled 2017-02-15: qty 7

## 2017-02-15 MED ORDER — FENTANYL CITRATE (PF) 100 MCG/2ML IJ SOLN
50.0000 ug | INTRAMUSCULAR | Status: DC | PRN
  Administered 2017-02-15: 100 ug via INTRAVENOUS

## 2017-02-15 MED ORDER — DEXAMETHASONE SODIUM PHOSPHATE 4 MG/ML IJ SOLN
INTRAMUSCULAR | Status: DC | PRN
  Administered 2017-02-15: 10 mg via INTRAVENOUS

## 2017-02-15 MED ORDER — SODIUM CHLORIDE 0.9 % IJ SOLN
INTRAMUSCULAR | Status: AC
  Filled 2017-02-15: qty 10

## 2017-02-15 MED ORDER — OXYCODONE HCL 5 MG/5ML PO SOLN: 5.0000 mg | Freq: Once | ORAL | Status: DC | PRN

## 2017-02-15 MED ORDER — MEPERIDINE HCL 25 MG/ML IJ SOLN: 6.2500 mg | INTRAMUSCULAR | Status: DC | PRN

## 2017-02-15 MED ORDER — GABAPENTIN 300 MG PO CAPS
ORAL_CAPSULE | ORAL | Status: AC
  Filled 2017-02-15: qty 1

## 2017-02-15 MED ORDER — CHLORHEXIDINE GLUCONATE CLOTH 2 % EX PADS: 6.0000 | MEDICATED_PAD | Freq: Once | CUTANEOUS | Status: DC

## 2017-02-15 MED ORDER — CEFAZOLIN SODIUM-DEXTROSE 2-4 GM/100ML-% IV SOLN
INTRAVENOUS | Status: AC
  Filled 2017-02-15: qty 100

## 2017-02-15 MED ORDER — FENTANYL CITRATE (PF) 100 MCG/2ML IJ SOLN
INTRAMUSCULAR | Status: AC
  Filled 2017-02-15: qty 2

## 2017-02-15 MED ORDER — EPHEDRINE 5 MG/ML INJ
INTRAVENOUS | Status: AC
  Filled 2017-02-15: qty 10

## 2017-02-15 MED ORDER — PROPOFOL 10 MG/ML IV BOLUS
INTRAVENOUS | Status: DC | PRN
  Administered 2017-02-15: 150 mg via INTRAVENOUS

## 2017-02-15 MED ORDER — ACETAMINOPHEN 500 MG PO TABS
1000.0000 mg | ORAL_TABLET | ORAL | Status: AC
  Administered 2017-02-15: 1000 mg via ORAL

## 2017-02-15 MED ORDER — OXYCODONE HCL 5 MG PO TABS: 5.0000 mg | ORAL_TABLET | Freq: Once | ORAL | Status: DC | PRN

## 2017-02-15 MED ORDER — ACETAMINOPHEN 500 MG PO TABS
ORAL_TABLET | ORAL | Status: AC
  Filled 2017-02-15: qty 2

## 2017-02-15 MED ORDER — LIDOCAINE 2% (20 MG/ML) 5 ML SYRINGE
INTRAMUSCULAR | Status: DC | PRN
  Administered 2017-02-15: 60 mg via INTRAVENOUS

## 2017-02-15 MED ORDER — BUPIVACAINE HCL (PF) 0.25 % IJ SOLN
INTRAMUSCULAR | Status: AC
  Filled 2017-02-15: qty 30

## 2017-02-15 MED ORDER — ROCURONIUM BROMIDE 10 MG/ML (PF) SYRINGE
PREFILLED_SYRINGE | INTRAVENOUS | Status: AC
  Filled 2017-02-15: qty 5

## 2017-02-15 MED ORDER — EPHEDRINE SULFATE 50 MG/ML IJ SOLN
INTRAMUSCULAR | Status: DC | PRN
  Administered 2017-02-15 (×2): 10 mg via INTRAVENOUS

## 2017-02-15 MED ORDER — SCOPOLAMINE 1 MG/3DAYS TD PT72: 1.0000 | MEDICATED_PATCH | Freq: Once | TRANSDERMAL | Status: DC | PRN

## 2017-02-15 MED ORDER — HYDROMORPHONE HCL 1 MG/ML IJ SOLN: 0.2500 mg | INTRAMUSCULAR | Status: DC | PRN

## 2017-02-15 MED ORDER — MIDAZOLAM HCL 2 MG/2ML IJ SOLN
INTRAMUSCULAR | Status: AC
  Filled 2017-02-15: qty 2

## 2017-02-15 MED ORDER — GABAPENTIN 300 MG PO CAPS
300.0000 mg | ORAL_CAPSULE | ORAL | Status: AC
  Administered 2017-02-15: 300 mg via ORAL

## 2017-02-15 MED ORDER — LACTATED RINGERS IV SOLN
INTRAVENOUS | Status: DC
Start: 2017-02-15 — End: 2017-02-15

## 2017-02-15 MED ORDER — LACTATED RINGERS IV SOLN
INTRAVENOUS | Status: DC
  Administered 2017-02-15 (×2): via INTRAVENOUS

## 2017-02-15 MED ORDER — MIDAZOLAM HCL 2 MG/2ML IJ SOLN
1.0000 mg | INTRAMUSCULAR | Status: DC | PRN
  Administered 2017-02-15: 2 mg via INTRAVENOUS

## 2017-02-15 MED ORDER — PROMETHAZINE HCL 25 MG/ML IJ SOLN: 6.2500 mg | INTRAMUSCULAR | Status: DC | PRN

## 2017-02-15 SURGICAL SUPPLY — 62 items
ADH SKN CLS APL DERMABOND .7 (GAUZE/BANDAGES/DRESSINGS) ×2
BINDER BREAST LRG (GAUZE/BANDAGES/DRESSINGS) IMPLANT
BINDER BREAST MEDIUM (GAUZE/BANDAGES/DRESSINGS) ×1 IMPLANT
BINDER BREAST XLRG (GAUZE/BANDAGES/DRESSINGS) IMPLANT
BINDER BREAST XXLRG (GAUZE/BANDAGES/DRESSINGS) IMPLANT
BLADE SURG 15 STRL LF DISP TIS (BLADE) ×2 IMPLANT
BLADE SURG 15 STRL SS (BLADE) ×6
CANISTER SUC SOCK COL 7IN (MISCELLANEOUS) IMPLANT
CANISTER SUCT 1200ML W/VALVE (MISCELLANEOUS) ×1 IMPLANT
CHLORAPREP W/TINT 26ML (MISCELLANEOUS) ×4 IMPLANT
CLIP TI WIDE RED SMALL 6 (CLIP) ×1 IMPLANT
COVER BACK TABLE 60X90IN (DRAPES) ×3 IMPLANT
COVER MAYO STAND STRL (DRAPES) ×3 IMPLANT
COVER PROBE W GEL 5X96 (DRAPES) ×3 IMPLANT
DECANTER SPIKE VIAL GLASS SM (MISCELLANEOUS) ×1 IMPLANT
DERMABOND ADVANCED (GAUZE/BANDAGES/DRESSINGS) ×1
DERMABOND ADVANCED .7 DNX12 (GAUZE/BANDAGES/DRESSINGS) ×2 IMPLANT
DEVICE DUBIN W/COMP PLATE 8390 (MISCELLANEOUS) ×4 IMPLANT
DRAPE LAPAROSCOPIC ABDOMINAL (DRAPES) ×3 IMPLANT
DRAPE UTILITY XL STRL (DRAPES) ×3 IMPLANT
DRSG TEGADERM 4X4.75 (GAUZE/BANDAGES/DRESSINGS) IMPLANT
ELECT COATED BLADE 2.86 ST (ELECTRODE) ×3 IMPLANT
ELECT REM PT RETURN 9FT ADLT (ELECTROSURGICAL) ×3
ELECTRODE REM PT RTRN 9FT ADLT (ELECTROSURGICAL) ×2 IMPLANT
GAUZE SPONGE 4X4 12PLY STRL LF (GAUZE/BANDAGES/DRESSINGS) IMPLANT
GLOVE BIO SURGEON STRL SZ7 (GLOVE) ×7 IMPLANT
GLOVE BIOGEL M STRL SZ7.5 (GLOVE) ×1 IMPLANT
GLOVE BIOGEL PI IND STRL 7.0 (GLOVE) IMPLANT
GLOVE BIOGEL PI IND STRL 7.5 (GLOVE) ×2 IMPLANT
GLOVE BIOGEL PI IND STRL 8 (GLOVE) IMPLANT
GLOVE BIOGEL PI INDICATOR 7.0 (GLOVE) ×1
GLOVE BIOGEL PI INDICATOR 7.5 (GLOVE) ×1
GLOVE BIOGEL PI INDICATOR 8 (GLOVE) ×1
GOWN STRL REUS W/ TWL LRG LVL3 (GOWN DISPOSABLE) ×4 IMPLANT
GOWN STRL REUS W/ TWL XL LVL3 (GOWN DISPOSABLE) IMPLANT
GOWN STRL REUS W/TWL LRG LVL3 (GOWN DISPOSABLE) ×3
GOWN STRL REUS W/TWL XL LVL3 (GOWN DISPOSABLE) ×3
HEMOSTAT ARISTA ABSORB 3G PWDR (MISCELLANEOUS) IMPLANT
ILLUMINATOR WAVEGUIDE N/F (MISCELLANEOUS) ×1 IMPLANT
KIT MARKER MARGIN INK (KITS) ×4 IMPLANT
LIGHT WAVEGUIDE WIDE FLAT (MISCELLANEOUS) IMPLANT
NDL HYPO 25X1 1.5 SAFETY (NEEDLE) ×2 IMPLANT
NEEDLE HYPO 25X1 1.5 SAFETY (NEEDLE) ×3 IMPLANT
NS IRRIG 1000ML POUR BTL (IV SOLUTION) ×1 IMPLANT
PACK BASIN DAY SURGERY FS (CUSTOM PROCEDURE TRAY) ×3 IMPLANT
PENCIL BUTTON HOLSTER BLD 10FT (ELECTRODE) ×3 IMPLANT
SLEEVE SCD COMPRESS KNEE MED (MISCELLANEOUS) ×3 IMPLANT
SPONGE LAP 4X18 X RAY DECT (DISPOSABLE) ×4 IMPLANT
STRIP CLOSURE SKIN 1/2X4 (GAUZE/BANDAGES/DRESSINGS) ×3 IMPLANT
SUT MNCRL AB 4-0 PS2 18 (SUTURE) ×4 IMPLANT
SUT MON AB 5-0 PS2 18 (SUTURE) IMPLANT
SUT SILK 2 0 SH (SUTURE) IMPLANT
SUT VIC AB 2-0 SH 27 (SUTURE) ×6
SUT VIC AB 2-0 SH 27XBRD (SUTURE) ×2 IMPLANT
SUT VIC AB 3-0 SH 27 (SUTURE) ×6
SUT VIC AB 3-0 SH 27X BRD (SUTURE) ×2 IMPLANT
SUT VIC AB 5-0 PS2 18 (SUTURE) IMPLANT
SYR CONTROL 10ML LL (SYRINGE) ×3 IMPLANT
TOWEL OR 17X24 6PK STRL BLUE (TOWEL DISPOSABLE) ×3 IMPLANT
TOWEL OR NON WOVEN STRL DISP B (DISPOSABLE) ×2 IMPLANT
TUBE CONNECTING 20X1/4 (TUBING) ×1 IMPLANT
YANKAUER SUCT BULB TIP NO VENT (SUCTIONS) ×1 IMPLANT

## 2017-02-15 NOTE — H&P (Signed)
51 yof referred by Dr Inda Merlin for newly diagnosed left breast dcis and right breast csl. she has bilateral subpectoral implants that were replaced in 2016 for rupture of prior saline implants. she has c density breasts. she has right breast distortion and two areas of left breast calcs. there is luoq 4 mm calcs and left lateral 10 mm calcs. she has fh in mom who is deceased from breast cancer dx at age 53. biopsy of right side is csl. biopsy of left loq area is hg dcis and is er/pr positive, the other left breast is benign and concordant. she is here with her husband to discuss surgical options. she is active smoker.here to evaluate prior to seed placement for large hematoma. this is nearly resolved. no other questions. She did have filler in left cheek with a reaction. no fevers. It does not appear to have infection. was given abx and prednisone by treating md.    Past Surgical History Tawni Pummel, RN; 01/27/2017 7:42 AM) Breast Biopsy  multiple Breast Reconstruction  Bilateral. Hemorrhoidectomy   Diagnostic Studies History Tawni Pummel, RN; 01/27/2017 7:42 AM) Colonoscopy  1-5 years ago within last year Mammogram  within last year >3 years ago Pap Smear  1-5 years ago  Medication History Tawni Pummel, RN; 01/27/2017 7:42 AM) Medications Reconciled  Social History Tawni Pummel, RN; 01/27/2017 7:42 AM) Alcohol use  Occasional alcohol use. Caffeine use  Tea. No drug use  Tobacco use  Current every day smoker.  Family History Tawni Pummel, RN; 01/27/2017 7:42 AM) Breast Cancer  Mother.  Pregnancy / Birth History Tawni Pummel, RN; 01/27/2017 7:42 AM) Age at menarche  51 years. Age of menopause  41-55  Other Problems Tawni Pummel, RN; 01/27/2017 7:42 AM) Anxiety Disorder  Back Pain  Hemorrhoids  High blood pressure  Lump In Breast  Melanoma   Review of Systems Sunday Spillers Ledford RN; 01/27/2017 7:42 AM) General Not Present- Appetite Loss, Chills,  Fatigue, Fever, Night Sweats, Weight Gain and Weight Loss. Skin Not Present- Change in Wart/Mole, Dryness, Hives, Jaundice, New Lesions, Non-Healing Wounds, Rash and Ulcer. HEENT Present- Seasonal Allergies and Wears glasses/contact lenses. Not Present- Earache, Hearing Loss, Hoarseness, Nose Bleed, Oral Ulcers, Ringing in the Ears, Sinus Pain, Sore Throat, Visual Disturbances and Yellow Eyes. Respiratory Not Present- Bloody sputum, Chronic Cough, Difficulty Breathing, Snoring and Wheezing. Breast Present- Breast Mass and Breast Pain. Not Present- Nipple Discharge and Skin Changes. Cardiovascular Not Present- Chest Pain, Difficulty Breathing Lying Down, Leg Cramps, Palpitations, Rapid Heart Rate, Shortness of Breath and Swelling of Extremities. Gastrointestinal Present- Hemorrhoids and Rectal Pain. Not Present- Abdominal Pain, Bloating, Bloody Stool, Change in Bowel Habits, Chronic diarrhea, Constipation, Difficulty Swallowing, Excessive gas, Gets full quickly at meals, Indigestion, Nausea and Vomiting. Female Genitourinary Not Present- Frequency, Nocturia, Painful Urination, Pelvic Pain and Urgency. Musculoskeletal Present- Back Pain. Not Present- Joint Pain, Joint Stiffness, Muscle Pain, Muscle Weakness and Swelling of Extremities. Neurological Not Present- Decreased Memory, Fainting, Headaches, Numbness, Seizures, Tingling, Tremor, Trouble walking and Weakness. Psychiatric Present- Anxiety. Not Present- Bipolar, Change in Sleep Pattern, Depression, Fearful and Frequent crying. Endocrine Present- Hot flashes. Not Present- Cold Intolerance, Excessive Hunger, Hair Changes, Heat Intolerance and New Diabetes. Hematology Not Present- Blood Thinners, Easy Bruising, Excessive bleeding, Gland problems, HIV and Persistent Infections.   Physical Exam Rolm Bookbinder MD; 01/27/2017 12:09 PM) General Mental Status-Alert. Orientation-Oriented X3. Eye Sclera/Conjunctiva - Bilateral-No scleral  icterus. Chest and Lung Exam Chest and lung exam reveals -quiet, even and easy respiratory effort with  no use of accessory muscles and on auscultation, normal breath sounds, no adventitious sounds and normal vocal resonance. Breast Nipples-No Discharge. Breast Lump-No Palpable Breast Mass. Note: hematoma luoq, bilateral implants Cardiovascular Cardiovascular examination reveals -normal heart sounds, regular rate and rhythm with no murmurs. Lymphatic Head & Neck General Head & Neck Lymphatics: Bilateral - Description - Normal. Axillary General Axillary Region: Bilateral - Description - Normal. Note: no Grant adenopathy   Assessment & Plan Rolm Bookbinder MD; 01/27/2017 1:28 PM) RADIAL SCAR OF BREAST (N64.89) Story: we discussed csl as benign lesion but there is up to 10% risk of upgrading from csl. I think her chance is possible due to fh and dcis on other side. I recommended seed guided excisional biopsy on this side BREAST NEOPLASM, TIS (DCIS), LEFT (D05.12) Story: we had a long conversation about pathophysiology of dcis. we reviewed all treatments for breast cancer. discussed dcis as stage 0 cancer without ability to spread. there is small possibility she might have invasive cancer on final specimen. she is interested in mastectomy and bilateral mastectomy. we discussed this is not recommendation from medical standpoint. I told them that mastectomy still has 5% recurrence rate which is basically the same as with lumpectomy/radiotherapy and antiestrogen (which will also reduce chance on other side). additionally nsm would require removal of implants and expanders with multistep reconstruction process and provide no cancer benefit. she would still continue to get mm and possible other biopsies with lumpectomy. I think smoking obviously puts her higher risk for nsm failure. after this conversation they are agreeable to left sided lumpectomy with seed guidance. I think large hematoma needs  to resolve first so will plan surgery in about 3 weeks. I will see her back before seed placement to ensure resolution When she went to get seed placed the other radiologist thought we should excise the other area of calcifications so she has had two seeds placed in the left breast

## 2017-02-15 NOTE — Discharge Instructions (Signed)
[facility] °Office Phone Number [phone] ° °POST OP INSTRUCTIONS ° °Always review your discharge instruction sheet given to you by the facility where your surgery was performed. ° °IF YOU HAVE DISABILITY OR FAMILY LEAVE FORMS, YOU MUST BRING THEM TO THE OFFICE FOR PROCESSING.  DO NOT GIVE THEM TO YOUR DOCTOR. ° °1. A prescription for pain medication may be given to you upon discharge.  Take your pain medication as prescribed, if needed.  If narcotic pain medicine is not needed, then you may take acetaminophen (Tylenol), naprosyn (Alleve) or ibuprofen (Advil) as needed. °2. Take your usually prescribed medications unless otherwise directed °3. If you need a refill on your pain medication, please contact your pharmacy.  They will contact our office to request authorization.  Prescriptions will not be filled after 5pm or on week-ends. °4. You should eat very light the first 24 hours after surgery, such as soup, crackers, pudding, etc.  Resume your normal diet the day after surgery. °5. Most patients will experience some swelling and bruising in the breast.  Ice packs and a good support bra will help.  Wear the breast binder provided or a sports bra for 72 hours day and night.  After that wear a sports bra during the day until you return to the office. Swelling and bruising can take several days to resolve.  °6. It is common to experience some constipation if taking pain medication after surgery.  Increasing fluid intake and taking a stool softener will usually help or prevent this problem from occurring.  A mild laxative (Milk of Magnesia or Miralax) should be taken according to package directions if there are no bowel movements after 48 hours. °7. Unless discharge instructions indicate otherwise, you may remove your bandages 48 hours after surgery and you may shower at that time.  You may have steri-strips (small skin tapes) in place directly over the incision.  These strips should be left on the  skin for 7-10 days and will come off on their own.  If your surgeon used skin glue on the incision, you may shower in 24 hours.  The glue will flake off over the next 2-3 weeks.  Any sutures or staples will be removed at the office during your follow-up visit. °8. ACTIVITIES:  You may resume regular daily activities (gradually increasing) beginning the next day.  Wearing a good support bra or sports bra minimizes pain and swelling.  You may have sexual intercourse when it is comfortable. °a. You may drive when you no longer are taking prescription pain medication, you can comfortably wear a seatbelt, and you can safely maneuver your car and apply brakes. °b. RETURN TO WORK:  ______________________________________________________________________________________ °9. You should see your doctor in the office for a follow-up appointment approximately two weeks after your surgery.  Your doctor’s nurse will typically make your follow-up appointment when she calls you with your pathology report.  Expect your pathology report 3-4 business days after your surgery.  You may call to check if you do not hear from us after three days. °10. OTHER INSTRUCTIONS: _______________________________________________________________________________________________ _____________________________________________________________________________________________________________________________________ °_____________________________________________________________________________________________________________________________________ °_____________________________________________________________________________________________________________________________________ ° °WHEN TO CALL DR WAKEFIELD: °1. Fever over 101.0 °2. Nausea and/or vomiting. °3. Extreme swelling or bruising. °4. Continued bleeding from incision. °5. Increased pain, redness, or drainage from the incision. ° °The clinic staff is available to answer your questions during regular  business hours.  Please don’t hesitate to call and ask to speak to one of the nurses for clinical concerns.  If   you have a medical emergency, go to the nearest emergency room or call 911.  A surgeon from [facility] is always on call at the hospital. ° °For further questions, please visit [email or web address] ° ° ° ° ° °Post Anesthesia Home Care Instructions ° °Activity: °Get plenty of rest for the remainder of the day. A responsible individual must stay with you for 24 hours following the procedure.  °For the next 24 hours, DO NOT: °-Drive a car °-Operate machinery °-Drink alcoholic beverages °-Take any medication unless instructed by your physician °-Make any legal decisions or sign important papers. ° °Meals: °Start with liquid foods such as gelatin or soup. Progress to regular foods as tolerated. Avoid greasy, spicy, heavy foods. If nausea and/or vomiting occur, drink only clear liquids until the nausea and/or vomiting subsides. Call your physician if vomiting continues. ° °Special Instructions/Symptoms: °Your throat may feel dry or sore from the anesthesia or the breathing tube placed in your throat during surgery. If this causes discomfort, gargle with warm salt water. The discomfort should disappear within 24 hours. ° °If you had a scopolamine patch placed behind your ear for the management of post- operative nausea and/or vomiting: ° °1. The medication in the patch is effective for 72 hours, after which it should be removed.  Wrap patch in a tissue and discard in the trash. Wash hands thoroughly with soap and water. °2. You may remove the patch earlier than 72 hours if you experience unpleasant side effects which may include dry mouth, dizziness or visual disturbances. °3. Avoid touching the patch. Wash your hands with soap and water after contact with the patch. °  ° °

## 2017-02-15 NOTE — Anesthesia Postprocedure Evaluation (Addendum)
Anesthesia Post Note  Patient: Toni Bowers  Procedure(s) Performed: Procedure(s) (LRB): RIGHT BREAST SEED GUIDED EXCISIONAL BREAST BIOPSY (Right) LEFT BREAST LUMPECTOMY WITH RADIOACTIVE SEED LOCALIZATION X 2 (Left)  Patient location during evaluation: PACU Anesthesia Type: General Level of consciousness: awake and alert Pain management: pain level controlled Vital Signs Assessment: post-procedure vital signs reviewed and stable Respiratory status: spontaneous breathing, nonlabored ventilation, respiratory function stable and patient connected to nasal cannula oxygen Cardiovascular status: blood pressure returned to baseline and stable Postop Assessment: no signs of nausea or vomiting Anesthetic complications: no       Last Vitals:  Vitals:   02/15/17 1530 02/15/17 1600  BP: 132/68 126/69  Pulse: 78 61  Resp: 16 18  Temp:  36.4 C    Last Pain:  Vitals:   02/15/17 1600  TempSrc:   PainSc: 0-No pain                 Effie Berkshire

## 2017-02-15 NOTE — Anesthesia Procedure Notes (Signed)
Procedure Name: LMA Insertion Date/Time: 02/15/2017 1:48 PM Performed by: Lieutenant Diego Pre-anesthesia Checklist: Patient identified, Emergency Drugs available, Suction available and Patient being monitored Patient Re-evaluated:Patient Re-evaluated prior to inductionOxygen Delivery Method: Circle system utilized Preoxygenation: Pre-oxygenation with 100% oxygen Intubation Type: IV induction Ventilation: Mask ventilation without difficulty LMA: LMA inserted LMA Size: 4.0 Number of attempts: 1 Airway Equipment and Method: Bite block Placement Confirmation: positive ETCO2 and breath sounds checked- equal and bilateral Tube secured with: Tape Dental Injury: Teeth and Oropharynx as per pre-operative assessment

## 2017-02-15 NOTE — Transfer of Care (Signed)
Immediate Anesthesia Transfer of Care Note  Patient: Toni Bowers  Procedure(s) Performed: Procedure(s): RIGHT BREAST SEED GUIDED EXCISIONAL BREAST BIOPSY (Right) LEFT BREAST LUMPECTOMY WITH RADIOACTIVE SEED LOCALIZATION X 2 (Left)  Patient Location: PACU  Anesthesia Type:General  Level of Consciousness: awake  Airway & Oxygen Therapy: Patient Spontanous Breathing and Patient connected to face mask oxygen  Post-op Assessment: Report given to RN and Post -op Vital signs reviewed and stable  Post vital signs: Reviewed and stable  Last Vitals:  Vitals:   02/15/17 1216  BP: 116/69  Pulse: (!) 54  Resp: 18  Temp: 36.9 C    Last Pain:  Vitals:   02/15/17 1216  TempSrc: Oral         Complications: No apparent anesthesia complications

## 2017-02-15 NOTE — Interval H&P Note (Signed)
History and Physical Interval Note:  02/15/2017 1:24 PM  Toni Bowers  has presented today for surgery, with the diagnosis of RIGHT BREAST RADIAL SCAR, LEFT BREAST DCIS  The various methods of treatment have been discussed with the patient and family. After consideration of risks, benefits and other options for treatment, the patient has consented to  Procedure(s): RIGHT BREAST SEED GUIDED EXCISIONAL BREAST BIOPSY (Right) LEFT BREAST LUMPECTOMY WITH RADIOACTIVE SEED LOCALIZATION (Left) as a surgical intervention .  The patient's history has been reviewed, patient examined, no change in status, stable for surgery.  I have reviewed the patient's chart and labs.  Questions were answered to the patient's satisfaction.     Talin Feister

## 2017-02-15 NOTE — Brief Op Note (Signed)
02/15/2017  3:00 PM  PATIENT:  Toni Bowers  51 y.o. female  PRE-OPERATIVE DIAGNOSIS:  RIGHT BREAST RADIAL SCAR, LEFT BREAST DUCTAL CARCINOMA IN SITU  POST-OPERATIVE DIAGNOSIS:  RIGHT BREAST RADIAL SCAR, LEFT BREAST DUCTAL CARCINOMA IN SITU  PROCEDURE:  Procedure(s): RIGHT BREAST SEED GUIDED EXCISIONAL BREAST BIOPSY (Right) LEFT BREAST LUMPECTOMY WITH RADIOACTIVE SEED LOCALIZATION X 2 (Left)  SURGEON:  Surgeon(s) and Role:    Rolm Bookbinder, MD - Primary  PHYSICIAN ASSISTANT:   ASSISTANTS: none   ANESTHESIA:   general  EBL:  Total I/O In: 1000 [I.V.:1000] Out: 10 [Blood:10]  BLOOD ADMINISTERED:none  DRAINS: none   LOCAL MEDICATIONS USED:  MARCAINE     SPECIMEN:  Biopsy / Limited Resection and Lumpectomy  DISPOSITION OF SPECIMEN:  PATHOLOGY  COUNTS:  YES  TOURNIQUET:  * No tourniquets in log *  DICTATION: .Dragon Dictation  PLAN OF CARE: Discharge to home after PACU  PATIENT DISPOSITION:  PACU - hemodynamically stable.   Delay start of Pharmacological VTE agent (>24hrs) due to surgical blood loss or risk of bleeding: not applicable

## 2017-02-15 NOTE — Anesthesia Preprocedure Evaluation (Addendum)
Anesthesia Evaluation  Patient identified by MRN, date of birth, ID band Patient awake    Reviewed: Allergy & Precautions, NPO status , Patient's Chart, lab work & pertinent test results  Airway Mallampati: I       Dental no notable dental hx.    Pulmonary Current Smoker,    Pulmonary exam normal        Cardiovascular hypertension, Normal cardiovascular exam     Neuro/Psych PSYCHIATRIC DISORDERS Anxiety negative neurological ROS     GI/Hepatic negative GI ROS, Neg liver ROS,   Endo/Other  negative endocrine ROS  Renal/GU negative Renal ROS  negative genitourinary   Musculoskeletal negative musculoskeletal ROS (+)   Abdominal   Peds negative pediatric ROS (+)  Hematology negative hematology ROS (+)   Anesthesia Other Findings Day of surgery medications reviewed with the patient.  Reproductive/Obstetrics negative OB ROS                            Anesthesia Physical Anesthesia Plan  ASA: II  Anesthesia Plan: General   Post-op Pain Management:    Induction: Intravenous  Airway Management Planned: LMA  Additional Equipment:   Intra-op Plan:   Post-operative Plan: Extubation in OR  Informed Consent: I have reviewed the patients History and Physical, chart, labs and discussed the procedure including the risks, benefits and alternatives for the proposed anesthesia with the patient or authorized representative who has indicated his/her understanding and acceptance.   Dental advisory given  Plan Discussed with: CRNA  Anesthesia Plan Comments:         Anesthesia Quick Evaluation

## 2017-02-15 NOTE — Op Note (Signed)
Preoperative diagnosis:Right breast complex sclerosing lesion Left breast calcifications Left breast dcis Postoperative diagnosis: same as above Procedure: 1. Leftbreast seed guided lumpectomy 2. Left breast seed guided excisional biopsy 3. Right breast seed guided excisional biopsy Surgeon Dr Serita Grammes Anes general  EBL: minimal Comps none Specimen:  1. Left breast dcis marked with paint, lower outer 2. Right breast marked with paint 3. Left breast upper outer marked with paint Sponge count correct at completion Dispoto recovery stable  Indications: This is a 18 yof who underwent screening mammography.  She has right breast lesion on mammogram that is a csl on core biopsy. She has two separate areas on the left breast. The lower outer lesion is dcis.  The upper outer is benign but discordant after reviewed.  We discussed two seed guided excisional biopsies and a seed guided lumpectomy of dcis.  She had three seeds placed prior to surgery and I had these mm in the OR  Procedure: After informed consent was obtained the patient was taken to the OR. She was given anitibiotics. SCDs were in place. She was prepped and draped in the standard sterile surgical fashion. A timeout was performed.   The right breast seed was very anterior and close to skin. I elected to make uoq curvilinear incision over the seed. I then used cautery to remove the seed and surrounding tissue.  The seed specimen was removed. MM confirmed removal of seed. I removed second portion above this that contained the clip and this was confirmed by mm also.  I obtained hemostasis. I then closed with 3-0 vicryl and 4-0 monocryl Glue was applied. I then turned attention to the left side.  The uoq seed again was very close to the skin so I infiltrated marcaine over this and made a curvilinear incision. I then removed the seed and some surrounding tissue.  Removal of the clip and the seed were confirmed by mm.  I then  closed this with 3-0 vicryl and 4-0 monocryl. Glue was placed.  I then located the seed at confirmed dcis.    The seed again was very close to the skin so I infiltrated marcaine over this and made a curvilinear incision. I then removed the seed and some surrounding tissue. This was done with attempt to get clear margins.   Removal of the clip and the seed were confirmed by mm.  I then closed this with 3-0 vicryl and 4-0 monocryl. Glue was placed.  A breast binder was placed. She was extubated and transferred to recovery stable

## 2017-02-16 ENCOUNTER — Encounter (HOSPITAL_BASED_OUTPATIENT_CLINIC_OR_DEPARTMENT_OTHER): Payer: Self-pay | Admitting: General Surgery

## 2017-02-19 ENCOUNTER — Encounter: Payer: Self-pay | Admitting: Radiation Oncology

## 2017-02-26 NOTE — Progress Notes (Signed)
Location of Breast Cancer:Lower-outer quadrant of left breast of female  Histology per Pathology Report: 02-15-17    Receptor Status: ER(95%+), PR (70%+), Her2-neu (), Ki-()                                                             01-19-17                                                                                                                                                                                                                                                                                                                   Receptor Status: ER(95%+), PR (70%+), Her2-neu (), Ki-()  Did patient present with symptoms (if so, please note symptoms) or was this found on screening mammography?: screening mammogram    Past/Anticipated interventions by surgeon, if any:02-15-17 Dr. Reece Leader Right Lumpectomy, 01-19-17 Right breast biopsy  Past/Anticipated interventions by medical oncology, if any:  external radiotherapy to the breast followed by antiestrogen therapy  Lymphedema issues, if any: None  Pain issues, if any: None  SAFETY ISSUES:  Prior radiation?: None  Pacemaker/ICD? None  Possible current pregnancy? No  Is the patient on methotrexate? No Menarche 11,G0P0 BC No Menaopause 49 HRT No Skin to left breast and right breast incision areas healing without signs of infections.  Will see Dr. Rolm Bookbinder 03-12-17  Current Complaints / other details: 51 year old married woman, mother has history of breast cancer   Wt Readings from Last 3 Encounters:  03/02/17 105 lb 6.4 oz (47.8 kg)  02/15/17 104 lb (47.2 kg)  01/27/17 106 lb 12.8 oz (48.4 kg)  BP 129/71   Pulse 70   Temp 97.6 F (36.4 C) (Oral)   Resp 16   Ht _0  (1.6 m)   Wt 105 lb 6.4  oz (47.8 kg)   SpO2 100%   BMI 18.67 kg/m   Georgena Spurling, RN 02/26/2017,11:21 AM

## 2017-02-26 NOTE — Addendum Note (Signed)
Addendum  created 02/26/17 1311 by Effie Berkshire, MD   Sign clinical note

## 2017-03-02 ENCOUNTER — Encounter: Payer: Self-pay | Admitting: Radiation Oncology

## 2017-03-02 ENCOUNTER — Ambulatory Visit
Admission: RE | Admit: 2017-03-02 | Discharge: 2017-03-02 | Disposition: A | Discharge status: Home / Self Care | Payer: 59 | Source: Ambulatory Visit | Attending: Radiation Oncology | Admitting: Radiation Oncology

## 2017-03-02 VITALS — BP 129/71 | HR 70 | Temp 97.6°F | Resp 16 | Ht 63.0 in | Wt 105.4 lb

## 2017-03-02 DIAGNOSIS — Z9889 Other specified postprocedural states: Secondary | ICD-10-CM | POA: Diagnosis not present

## 2017-03-02 DIAGNOSIS — I10 Essential (primary) hypertension: Secondary | ICD-10-CM | POA: Diagnosis not present

## 2017-03-02 DIAGNOSIS — Z170 Estrogen receptor positive status [ER+]: Secondary | ICD-10-CM | POA: Diagnosis not present

## 2017-03-02 DIAGNOSIS — E739 Lactose intolerance, unspecified: Secondary | ICD-10-CM | POA: Diagnosis not present

## 2017-03-02 DIAGNOSIS — F419 Anxiety disorder, unspecified: Secondary | ICD-10-CM | POA: Diagnosis not present

## 2017-03-02 DIAGNOSIS — D0512 Intraductal carcinoma in situ of left breast: Secondary | ICD-10-CM | POA: Diagnosis not present

## 2017-03-02 DIAGNOSIS — Z510 Encounter for antineoplastic radiation therapy: Secondary | ICD-10-CM | POA: Diagnosis not present

## 2017-03-02 DIAGNOSIS — Z79899 Other long term (current) drug therapy: Secondary | ICD-10-CM | POA: Diagnosis not present

## 2017-03-02 DIAGNOSIS — C50512 Malignant neoplasm of lower-outer quadrant of left female breast: Secondary | ICD-10-CM

## 2017-03-02 DIAGNOSIS — K5909 Other constipation: Secondary | ICD-10-CM | POA: Diagnosis not present

## 2017-03-02 DIAGNOSIS — F17210 Nicotine dependence, cigarettes, uncomplicated: Secondary | ICD-10-CM | POA: Diagnosis not present

## 2017-03-02 NOTE — Progress Notes (Signed)
Radiation Oncology         [phone]) [phone] ________________________________  Name: Toni Bowers MRN: [id number]  Date: 03/02/2017  DOB: January 10, 1966  DD:UKGUR, Butch Penny, MD  Rolm Bookbinder, MD     REFERRING PHYSICIAN: Rolm Bookbinder, MD   DIAGNOSIS: The encounter diagnosis was Malignant neoplasm of lower-outer quadrant of left breast of female, estrogen receptor positive ([place]).   HISTORY OF PRESENT ILLNESS: Toni Bowers is a 51 y.o. female originally seen in the multidisciplinary breast clinic on 01/27/2017 for a new diagnosis of left breast cancer. She was found to have calcifications in the left breast and right breast distortion on screening mammogram on 01/04/17. She went on to have diagnostic imaging including ultrasound which revealed a 4 mm group of amorphous calcifications  in the upper outer left breast and within the lateral left breast there was another group of calcifications measuring 10 mm in total this measured up to 1.5 cm. The axilla was not evaluated on the left. No right sided abnormalities were seen and the axilla was negative on the right.  A biopsy on 01/19/17 of the lower outer quadrant of the left breast and of the upper outer quadrant were performed as was a biopsy of the right. The right breast biopsy revealed a complex sclerosing lesion with calcifications of the right upper outer quadrant without malignancy identified.  The lower outer quadrant revealed high grade DCIS lesions with calcifications, ER/PR positive. The upper outer quadrant biopsy revealed benign soft tissue and blood.  The patient proceeded with right excisional biopsy and left lumpectomy on 02/15/17. Right lumpectomy revealed a complex sclerosing lesion with calcifications, fibrocystic changes with focal fibroadenomatoid change, and no evidence of malignancy. Superior left lumpectomy revealed fibrocystic changes with calcifications, and no malignancy was identified. Left LOQ lumpectomy revealed high grade  DCIS measuring 1.5 cm, DCIS was focally 0.2 cm from the anterior margin, and fibrocystic changes.The patient presents in clinic today to further discuss radiation for the management of her disease.  PREVIOUS RADIATION THERAPY: No   PAST MEDICAL HISTORY:  Past Medical History:  Diagnosis Date  . Anxiety   . Chronic constipation   . Hypertension   . Internal prolapsed hemorrhoids    and bleeding  . Lactose intolerance   . Wears glasses        PAST SURGICAL HISTORY: Past Surgical History:  Procedure Laterality Date  . AUGMENTATION MAMMAPLASTY Bilateral 1999   retro-pectoral saline  . BREAST IMPLANT EXCHANGE  01/  2016  . BREAST LUMPECTOMY WITH RADIOACTIVE SEED LOCALIZATION Left 02/15/2017   Procedure: LEFT BREAST LUMPECTOMY WITH RADIOACTIVE SEED LOCALIZATION X 2;  Surgeon: Rolm Bookbinder, MD;  Location: [facility];  Service: General;  Laterality: Left;  . HEMORRHOID SURGERY N/A 10/17/2015   Procedure: HEMORRHOIDECTOMY;  Surgeon: Jackolyn Confer, MD;  Location: [facility];  Service: General;  Laterality: N/A;  . PLACEMENT OF BREAST IMPLANTS Bilateral 1999  . RADIOACTIVE SEED GUIDED EXCISIONAL BREAST BIOPSY Right 02/15/2017   Procedure: RIGHT BREAST SEED GUIDED EXCISIONAL BREAST BIOPSY;  Surgeon: Rolm Bookbinder, MD;  Location: [facility];  Service: General;  Laterality: Right;     FAMILY HISTORY:  Family History  Problem Relation Age of Onset  . Breast cancer Mother      SOCIAL HISTORY:  reports that she has been smoking Cigarettes.  She has a 30.00 pack-year smoking history. She has never used smokeless tobacco. She reports that she does not drink alcohol or use drugs.  She is married  and lives in [place]. She stays at home. She has two chihuaha dogs named Peanut and Izzy.  ALLERGIES: Codeine; Metoprolol succinate [metoprolol]; Zoloft [sertraline hcl]; and Adhesive [tape]   MEDICATIONS:  Current Outpatient Prescriptions   Medication Sig Dispense Refill  . azelastine (ASTELIN) 0.1 % nasal spray Place 1 spray into both nostrils 2 (two) times daily. Use in each nostril as directed    . Biotin 5000 MCG CAPS Take 1 capsule by mouth every morning.    . busPIRone (BUSPAR) 10 MG tablet Take 10 mg by mouth daily.     . Calcium Carbonate-Vitamin D (CALCIUM 600+D) 600-400 MG-UNIT tablet Take 1 tablet by mouth daily.    Marland Kitchen ibuprofen (ADVIL,MOTRIN) 200 MG tablet Take 800 mg by mouth every 6 (six) hours as needed.    Marland Kitchen lisinopril (PRINIVIL,ZESTRIL) 5 MG tablet Take 5 mg by mouth daily.    . Multiple Vitamin (MULTIVITAMIN) tablet Take 1 tablet by mouth daily.    . Omega-3 Fatty Acids (FISH OIL) 1200 MG CAPS Take 1 capsule by mouth daily.    . polycarbophil (FIBERCON) 625 MG tablet Take 625 mg by mouth 2 (two) times daily.    . vitamin B-12 (CYANOCOBALAMIN) 1000 MCG tablet Take 1,000 mcg by mouth daily.    . ondansetron (ZOFRAN) 4 MG tablet Take 1 tablet (4 mg total) by mouth every 4 (four) hours as needed for nausea. (Patient not taking: Reported on 01/27/2017) 30 tablet 0  . oxyCODONE (OXY IR/ROXICODONE) 5 MG immediate release tablet Take 1-2 tablets (5-10 mg total) by mouth every 4 (four) hours as needed for moderate pain, severe pain or breakthrough pain. (Patient not taking: Reported on 01/27/2017) 40 tablet 0  . traMADol (ULTRAM) 50 MG tablet Take 1 tablet (50 mg total) by mouth every 6 (six) hours as needed for severe pain. (Patient not taking: Reported on 03/02/2017) 10 tablet 1   No current facility-administered medications for this encounter.      REVIEW OF SYSTEMS: On review of systems, the patient reports that she is doing well overall. She denies any chest pain, shortness of breath, cough, fevers, chills, night sweats, unintended weight changes. She denies any bowel or bladder disturbances, and denies abdominal pain, nausea or vomiting. She denies any new musculoskeletal or joint aches or pains. A complete review of  systems is obtained and is otherwise negative.  PHYSICAL EXAM:  Wt Readings from Last 3 Encounters:  03/02/17 105 lb 6.4 oz (47.8 kg)  02/15/17 104 lb (47.2 kg)  01/27/17 106 lb 12.8 oz (48.4 kg)   Temp Readings from Last 3 Encounters:  03/02/17 97.6 F (36.4 C) (Oral)  02/15/17 97.6 F (36.4 C)  01/27/17 97.7 F (36.5 C) (Oral)   BP Readings from Last 3 Encounters:  03/02/17 129/71  02/15/17 126/69  01/27/17 (!) 157/83   Pulse Readings from Last 3 Encounters:  03/02/17 70  02/15/17 61  01/27/17 69    In general this is a well appearing Caucasian female in no acute distress. She's alert and oriented x4 and appropriate throughout the examination. Cardiopulmonary assessment is negative for acute distress and she exhibits normal effort. Breast exam revealed well healed lumpectomy sites of the left breast. No separation, bleeding, or erythema of the breast was noted.   LABORATORY DATA:  Lab Results  Component Value Date   WBC 6.9 01/27/2017   HGB 14.7 01/27/2017   HCT 41.9 01/27/2017   MCV 96.8 01/27/2017   PLT 233 01/27/2017   Lab Results  Component Value Date   NA 143 01/27/2017   K 4.1 01/27/2017   CL 102 10/14/2015   CO2 30 (H) 01/27/2017   Lab Results  Component Value Date   ALT 21 01/27/2017   AST 24 01/27/2017   ALKPHOS 70 01/27/2017   BILITOT 0.51 01/27/2017      RADIOGRAPHY: Mm Breast Surgical Specimen  Result Date: 02/15/2017 CLINICAL DATA:  Status post excisional biopsy of the right breast. The patient also had a left lumpectomy and a left excisional biopsy on the same day. Specimen radiographs from these additional sites are dictated separately. EXAM: SPECIMEN RADIOGRAPH OF THE RIGHT BREAST COMPARISON:  Previous exam(s). FINDINGS: Status post excision of the right breast. The radioactive seed and coil shaped biopsy marker clip are present, completely intact, and were marked for pathology. Findings were communicated to the operating room via telephone  at 2:20 p.m. on 02/15/2017. IMPRESSION: Specimen radiograph of the right breast. Electronically Signed   By: Pamelia Hoit M.D.   On: 02/15/2017 15:04   Mm Breast Surgical Specimen  Result Date: 02/15/2017 CLINICAL DATA:  51 year old female status post left lumpectomy for DCIS. The patient also had same-day excisional biopsies of an additional site in the left breast and of a site in the right breast. Specimen radiographs from these additional sites are dictated separately. EXAM: SPECIMEN RADIOGRAPH OF THE LEFT BREAST COMPARISON:  Previous exam(s). FINDINGS: Status post excision of the left breast. The radioactive seed and coil shaped biopsy marker clip are present, completely intact, and were marked for pathology. Findings were communicated to the operating room via telephone at 2:55 p.m. on 02/15/2017 IMPRESSION: Specimen radiograph of the left breast. Electronically Signed   By: Pamelia Hoit M.D.   On: 02/15/2017 14:59   Mm Breast Surgical Specimen  Result Date: 02/15/2017 CLINICAL DATA:  X shaped biopsy marker clip placed in the upper outer left breast at the location of an attempted biopsy of calcifications which only revealed benign breast tissue without any definite calcifications, considered discordant and excision recommended. EXAM: SPECIMEN RADIOGRAPH OF THE LEFT BREAST COMPARISON:  Previous exam(s). FINDINGS: Status post excision of the left breast. The radioactive seed and X shaped biopsy marker clip are present, completely intact, and were marked for pathology. IMPRESSION: Specimen radiograph of the left breast. Electronically Signed   By: Claudie Revering M.D.   On: 02/15/2017 14:52   Mm Lt Radioactive Seed Loc Mammo Guide  Result Date: 02/12/2017 CLINICAL DATA:  51 year old female presents for radioactive seed localization for: 1. Biopsy-proven complex sclerosing lesion of the right breast at site of coil shaped biopsy marking clip 2. High grade ductal carcinoma in situ of the lower outer left  breast at site of coil shaped biopsy marking clip. 3. X shaped biopsy marking clip in the upper-outer left breast. Attempted biopsy of calcifications in this location only revealed benign breast tissue without any definite calcifications identified, and therefore this was considered discordant and excision of this location is recommended. This was discussed with the patient's surgeon, Dr. Rolm Bookbinder earlier today. This was also discussed extensively with the patient who agrees with seed localization and prefers this rather than a repeat biopsy. EXAM: MAMMOGRAPHIC GUIDED RADIOACTIVE SEED LOCALIZATION OF THE BILATERAL BREAST COMPARISON:  Previous exam(s). FINDINGS: Prior to bilateral breast radioactive seed localization, additional magnification views were performed of the X shaped biopsy marking clip at site of prior benign biopsy demonstrating faint amorphous calcifications present approximately 7-8 mm posterior to the biopsy marking clip. Therefore, the  X shaped biopsy marking clip and adjacent calcifications will be targeted for localization. Patient presents for radioactive seed localization prior to bilateral breast excision. I met with the patient and we discussed the procedure of seed localization including benefits and alternatives. We discussed the high likelihood of a successful procedure. We discussed the risks of the procedure including infection, bleeding, tissue injury and further surgery. We discussed the low dose of radioactivity involved in the procedure. Informed, written consent was given. The usual time-out protocol was performed immediately prior to the procedure. SITE 1: RIGHT BREAST COIL CLIP: Using mammographic guidance, sterile technique, 1% lidocaine and an I-125 radioactive seed, the coil shaped biopsy marking clip at site of distortion was localized using a lateral to medial approach. The follow-up mammogram images confirm the seed in the expected location and were marked for Dr.  Donne Hazel. Follow-up survey of the patient confirms presence of the radioactive seed. Order number of I-125 seed:  [id number]. Total activity:  6.962 millicuries  Reference Date: 01/22/2017 SITE 2: LEFT BREAST UPPER OUTER X SHAPED CLIP: Using mammographic guidance, sterile technique, 1% lidocaine and an I-125 radioactive seed, the X shaped biopsy marking clip and associated calcifications located just posterior to the clip was localized using a lateral to medial approach. The follow-up mammogram images confirm the seed in the expected location and were marked for Dr. Donne Hazel. Follow-up survey of the patient confirms presence of the radioactive seed. Order number of I-125 seed:  [id number]. Total activity:  4.010 millicuries  Reference Date: 02/09/2017 SITE 3: LEFT BREAST LOWER OUTER COIL SHAPED CLIP: Using mammographic guidance, sterile technique, 1% lidocaine and an I-125 radioactive seed, the coil shaped biopsy marking clip and associated calcifications were localized using a lateral to medial approach. The follow-up mammogram images confirm the seed in the expected location and were marked for Dr. Donne Hazel. Follow-up survey of the patient confirms presence of the radioactive seed. Order number of I-125 seed:  [id number]. Total activity:  0.347 millicuries  Reference Date: 02/09/2017 The patient tolerated the procedure well and was released from the [facility]. She was given instructions regarding seed removal. IMPRESSION: 1. Radioactive seed localization right breast complex sclerosing lesion. 2. Radioactive seed localization left breast lower outer quadrant coil shaped biopsy marking clip, at biopsy proven high grade ductal carcinoma in situ. 3. Radioactive seed localization left upper outer quadrant X shaped biopsy marking clip (at site of benign but discordant biopsy) with calcifications located 7-8 mm posterior to the clip. Electronically Signed   By: Everlean Alstrom M.D.   On: 02/12/2017 16:15   Mm Lt  Rad Seed Ea Add Lesion Loc Mammo  Result Date: 02/12/2017 CLINICAL DATA:  51 year old female presents for radioactive seed localization for: 1. Biopsy-proven complex sclerosing lesion of the right breast at site of coil shaped biopsy marking clip 2. High grade ductal carcinoma in situ of the lower outer left breast at site of coil shaped biopsy marking clip. 3. X shaped biopsy marking clip in the upper-outer left breast. Attempted biopsy of calcifications in this location only revealed benign breast tissue without any definite calcifications identified, and therefore this was considered discordant and excision of this location is recommended. This was discussed with the patient's surgeon, Dr. Rolm Bookbinder earlier today. This was also discussed extensively with the patient who agrees with seed localization and prefers this rather than a repeat biopsy. EXAM: MAMMOGRAPHIC GUIDED RADIOACTIVE SEED LOCALIZATION OF THE BILATERAL BREAST COMPARISON:  Previous exam(s). FINDINGS: Prior to bilateral breast radioactive seed localization,  additional magnification views were performed of the X shaped biopsy marking clip at site of prior benign biopsy demonstrating faint amorphous calcifications present approximately 7-8 mm posterior to the biopsy marking clip. Therefore, the X shaped biopsy marking clip and adjacent calcifications will be targeted for localization. Patient presents for radioactive seed localization prior to bilateral breast excision. I met with the patient and we discussed the procedure of seed localization including benefits and alternatives. We discussed the high likelihood of a successful procedure. We discussed the risks of the procedure including infection, bleeding, tissue injury and further surgery. We discussed the low dose of radioactivity involved in the procedure. Informed, written consent was given. The usual time-out protocol was performed immediately prior to the procedure. SITE 1: RIGHT  BREAST COIL CLIP: Using mammographic guidance, sterile technique, 1% lidocaine and an I-125 radioactive seed, the coil shaped biopsy marking clip at site of distortion was localized using a lateral to medial approach. The follow-up mammogram images confirm the seed in the expected location and were marked for Dr. Donne Hazel. Follow-up survey of the patient confirms presence of the radioactive seed. Order number of I-125 seed:  [id number]. Total activity:  1.478 millicuries  Reference Date: 01/22/2017 SITE 2: LEFT BREAST UPPER OUTER X SHAPED CLIP: Using mammographic guidance, sterile technique, 1% lidocaine and an I-125 radioactive seed, the X shaped biopsy marking clip and associated calcifications located just posterior to the clip was localized using a lateral to medial approach. The follow-up mammogram images confirm the seed in the expected location and were marked for Dr. Donne Hazel. Follow-up survey of the patient confirms presence of the radioactive seed. Order number of I-125 seed:  [id number]. Total activity:  6.578 millicuries  Reference Date: 02/09/2017 SITE 3: LEFT BREAST LOWER OUTER COIL SHAPED CLIP: Using mammographic guidance, sterile technique, 1% lidocaine and an I-125 radioactive seed, the coil shaped biopsy marking clip and associated calcifications were localized using a lateral to medial approach. The follow-up mammogram images confirm the seed in the expected location and were marked for Dr. Donne Hazel. Follow-up survey of the patient confirms presence of the radioactive seed. Order number of I-125 seed:  [id number]. Total activity:  4.132 millicuries  Reference Date: 02/09/2017 The patient tolerated the procedure well and was released from the [facility]. She was given instructions regarding seed removal. IMPRESSION: 1. Radioactive seed localization right breast complex sclerosing lesion. 2. Radioactive seed localization left breast lower outer quadrant coil shaped biopsy marking clip, at biopsy  proven high grade ductal carcinoma in situ. 3. Radioactive seed localization left upper outer quadrant X shaped biopsy marking clip (at site of benign but discordant biopsy) with calcifications located 7-8 mm posterior to the clip. Electronically Signed   By: Everlean Alstrom M.D.   On: 02/12/2017 16:15   Mm Rt Radioactive Seed Loc Mammo Guide  Result Date: 02/12/2017 CLINICAL DATA:  51 year old female presents for radioactive seed localization for: 1. Biopsy-proven complex sclerosing lesion of the right breast at site of coil shaped biopsy marking clip 2. High grade ductal carcinoma in situ of the lower outer left breast at site of coil shaped biopsy marking clip. 3. X shaped biopsy marking clip in the upper-outer left breast. Attempted biopsy of calcifications in this location only revealed benign breast tissue without any definite calcifications identified, and therefore this was considered discordant and excision of this location is recommended. This was discussed with the patient's surgeon, Dr. Rolm Bookbinder earlier today. This was also discussed extensively with the patient who agrees with  seed localization and prefers this rather than a repeat biopsy. EXAM: MAMMOGRAPHIC GUIDED RADIOACTIVE SEED LOCALIZATION OF THE BILATERAL BREAST COMPARISON:  Previous exam(s). FINDINGS: Prior to bilateral breast radioactive seed localization, additional magnification views were performed of the X shaped biopsy marking clip at site of prior benign biopsy demonstrating faint amorphous calcifications present approximately 7-8 mm posterior to the biopsy marking clip. Therefore, the X shaped biopsy marking clip and adjacent calcifications will be targeted for localization. Patient presents for radioactive seed localization prior to bilateral breast excision. I met with the patient and we discussed the procedure of seed localization including benefits and alternatives. We discussed the high likelihood of a successful  procedure. We discussed the risks of the procedure including infection, bleeding, tissue injury and further surgery. We discussed the low dose of radioactivity involved in the procedure. Informed, written consent was given. The usual time-out protocol was performed immediately prior to the procedure. SITE 1: RIGHT BREAST COIL CLIP: Using mammographic guidance, sterile technique, 1% lidocaine and an I-125 radioactive seed, the coil shaped biopsy marking clip at site of distortion was localized using a lateral to medial approach. The follow-up mammogram images confirm the seed in the expected location and were marked for Dr. Donne Hazel. Follow-up survey of the patient confirms presence of the radioactive seed. Order number of I-125 seed:  [id number]. Total activity:  0.254 millicuries  Reference Date: 01/22/2017 SITE 2: LEFT BREAST UPPER OUTER X SHAPED CLIP: Using mammographic guidance, sterile technique, 1% lidocaine and an I-125 radioactive seed, the X shaped biopsy marking clip and associated calcifications located just posterior to the clip was localized using a lateral to medial approach. The follow-up mammogram images confirm the seed in the expected location and were marked for Dr. Donne Hazel. Follow-up survey of the patient confirms presence of the radioactive seed. Order number of I-125 seed:  [id number]. Total activity:  8.315 millicuries  Reference Date: 02/09/2017 SITE 3: LEFT BREAST LOWER OUTER COIL SHAPED CLIP: Using mammographic guidance, sterile technique, 1% lidocaine and an I-125 radioactive seed, the coil shaped biopsy marking clip and associated calcifications were localized using a lateral to medial approach. The follow-up mammogram images confirm the seed in the expected location and were marked for Dr. Donne Hazel. Follow-up survey of the patient confirms presence of the radioactive seed. Order number of I-125 seed:  [id number]. Total activity:  1.062 millicuries  Reference Date: 02/09/2017 The patient  tolerated the procedure well and was released from the [facility]. She was given instructions regarding seed removal. IMPRESSION: 1. Radioactive seed localization right breast complex sclerosing lesion. 2. Radioactive seed localization left breast lower outer quadrant coil shaped biopsy marking clip, at biopsy proven high grade ductal carcinoma in situ. 3. Radioactive seed localization left upper outer quadrant X shaped biopsy marking clip (at site of benign but discordant biopsy) with calcifications located 7-8 mm posterior to the clip. Electronically Signed   By: Everlean Alstrom M.D.   On: 02/12/2017 16:15       IMPRESSION/PLAN: 1. 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 Dr. Lisbeth Renshaw reviews the pathology findings and we discussed that patient's course will now be followed by external radiotherapy to the left breast followed by antiestrogen therapy. We discussed the risks, benefits, short, and long term effects of radiotherapy, and the patient is interested in proceeding. Dr. Lisbeth Renshaw discussed the delivery and logistics of radiotherapy and highlights deep inspiration breath hold technique to avoid exposure to the heart due to her disease being left-sided. The patient is  scheduled to follow up with Dr. Donne Hazel on 03/12/17. Written consent is obtained and a copy provided to her. We will move forward with CT simulation on 03/09/17 in the afternoon and anticipate starting radiotherapy the week of 03/15/17. 2. Skin intolerance to certain adhesives. The patient does have intolerance to some bandage adhesives and would like to forgo tattoos as well during treatment. She will try using an adhesive skin marker in the skin prior to her simulation to see if she will tolerate this in place of tattoos during treatment.  In a visit lasting 25 minutes, greater than 50% of the time was spent face to face discussing her pathology, radiotherapy recommendations, and coordinating the patient's care.   The above  documentation reflects my direct findings during this shared patient visit. Please see the separate note by Dr. Lisbeth Renshaw on this date for the remainder of the patient's plan of care.    Carola Rhine, PAC  This document serves as a record of services personally performed by Shona Simpson, PA-C and Kyung Rudd, MD. It was created on their behalf by Darcus Austin, a trained medical scribe. The creation of this record is based on the scribe's personal observations and the providers' statements to them. This document has been checked and approved by the attending provider.

## 2017-03-02 NOTE — Addendum Note (Signed)
Encounter addended by: Malena Edman, RN on: 03/02/2017  1:12 PM<BR>    Actions taken: Charge Capture section accepted

## 2017-03-03 ENCOUNTER — Telehealth: Payer: Self-pay | Admitting: *Deleted

## 2017-03-03 NOTE — Telephone Encounter (Signed)
R/s appt with Dr. Jana Hakim to 8/13.  Confirmed appt with pt.

## 2017-03-09 ENCOUNTER — Ambulatory Visit
Admission: RE | Admit: 2017-03-09 | Discharge: 2017-03-09 | Disposition: A | Discharge status: Home / Self Care | Payer: 59 | Source: Ambulatory Visit | Attending: Radiation Oncology | Admitting: Radiation Oncology

## 2017-03-09 DIAGNOSIS — D0512 Intraductal carcinoma in situ of left breast: Secondary | ICD-10-CM | POA: Diagnosis not present

## 2017-03-09 DIAGNOSIS — C50512 Malignant neoplasm of lower-outer quadrant of left female breast: Secondary | ICD-10-CM

## 2017-03-09 DIAGNOSIS — Z170 Estrogen receptor positive status [ER+]: Principal | ICD-10-CM

## 2017-03-09 DIAGNOSIS — Z510 Encounter for antineoplastic radiation therapy: Secondary | ICD-10-CM | POA: Diagnosis not present

## 2017-03-09 NOTE — Progress Notes (Signed)
  Radiation Oncology         [phone]) [phone] ________________________________  Name: Toni Bowers MRN: [id number]  Date: 03/09/2017  DOB: 02-23-1966  DIAGNOSIS:     ICD-10-CM   1. Malignant neoplasm of lower-outer quadrant of left breast of female, estrogen receptor positive ([place]) C50.512    Z17.0   2. Ductal carcinoma in situ (DCIS) of left breast D05.12      SIMULATION AND TREATMENT PLANNING NOTE  The patient presented for simulation prior to beginning her course of radiation treatment for her diagnosis of left-sided breast cancer. The patient was placed in a supine position on a breast board. A customized vac-lock bag was constructed and this complex treatment device will be used on a daily basis during her treatment. In this fashion, a CT scan was obtained through the chest area and an isocenter was placed near the chest wall within the breast.  The patient will be planned to receive a course of radiation initially to a dose of 50.4 Gy. This will consist of a whole breast radiotherapy technique. To accomplish this, 2 customized blocks have been designed which will correspond to medial and lateral whole breast tangent fields. This treatment will be accomplished at 1.8 Gy per fraction. A forward planning technique will also be evaluated to determine if this approach improves the plan. It is anticipated that the patient will then receive a 10 Gy boost to the seroma cavity which has been contoured. This will be accomplished at 2 Gy per fraction.   This initial treatment will consist of a 3-D conformal technique. The seroma has been contoured as the primary target structure. Additionally, dose volume histograms of both this target as well as the lungs and heart will also be evaluated. Such an approach is necessary to ensure that the target area is adequately covered while the nearby critical  normal structures are adequately spared.  Plan:  The final anticipated total dose therefore will  correspond to 60.4 Gy.   Special treatment procedure was performed today due to the extra time and effort required by myself to plan and prepare this patient for deep inspiration breath hold technique.  I have determined cardiac sparing to be of benefit to this patient to prevent long term cardiac damage due to radiation of the heart.  Bellows were placed on the patient's abdomen. To facilitate cardiac sparing, the patient was coached by the radiation therapists on breath hold techniques and breathing practice was performed. Practice waveforms were obtained. The patient was then scanned while maintaining breath hold in the treatment position.  This image was then transferred over to the imaging specialist. The imaging specialist then created a fusion of the free breathing and breath hold scans using the chest wall as the stable structure. I personally reviewed the fusion in axial, coronal and sagittal image planes.  Excellent cardiac sparing was obtained.  I felt the patient is an appropriate candidate for breath hold and the patient will be treated as such.  The image fusion was then reviewed with the patient to reinforce the necessity of reproducible breath hold.    _______________________________   Jodelle Gross, MD, PhD

## 2017-03-09 NOTE — Progress Notes (Signed)
  Radiation Oncology         [phone]) [phone] ________________________________  Name: Toni Bowers MRN: [id number]  Date: 03/09/2017  DOB: 07-Jun-1966  Optical Surface Tracking Plan:  Since intensity modulated radiotherapy (IMRT) and 3D conformal radiation treatment methods are predicated on accurate and precise positioning for treatment, intrafraction motion monitoring is medically necessary to ensure accurate and safe treatment delivery.  The ability to quantify intrafraction motion without excessive ionizing radiation dose can only be performed with optical surface tracking. Accordingly, surface imaging offers the opportunity to obtain 3D measurements of patient position throughout IMRT and 3D treatments without excessive radiation exposure.  I am ordering optical surface tracking for this patient's upcoming course of radiotherapy. ________________________________  Kyung Rudd, MD 03/09/2017 5:40 PM    Reference:   Ursula Alert, J, et al. Surface imaging-based analysis of intrafraction motion for breast radiotherapy patients.Journal of [place], n. 6, nov. 2014. ISSN [id number].   Available at: <http://www.jacmp.org/index.php/jacmp/article/view/4957>.

## 2017-03-10 DIAGNOSIS — Z510 Encounter for antineoplastic radiation therapy: Secondary | ICD-10-CM | POA: Diagnosis not present

## 2017-03-10 DIAGNOSIS — D0512 Intraductal carcinoma in situ of left breast: Secondary | ICD-10-CM | POA: Diagnosis not present

## 2017-03-16 ENCOUNTER — Ambulatory Visit
Admission: RE | Admit: 2017-03-16 | Discharge: 2017-03-16 | Disposition: A | Discharge status: Home / Self Care | Payer: 59 | Source: Ambulatory Visit | Attending: Radiation Oncology | Admitting: Radiation Oncology

## 2017-03-16 DIAGNOSIS — Z510 Encounter for antineoplastic radiation therapy: Secondary | ICD-10-CM | POA: Diagnosis not present

## 2017-03-16 DIAGNOSIS — C50512 Malignant neoplasm of lower-outer quadrant of left female breast: Secondary | ICD-10-CM

## 2017-03-16 DIAGNOSIS — Z170 Estrogen receptor positive status [ER+]: Principal | ICD-10-CM

## 2017-03-16 DIAGNOSIS — D0512 Intraductal carcinoma in situ of left breast: Secondary | ICD-10-CM | POA: Diagnosis not present

## 2017-03-16 MED ORDER — RADIAPLEXRX EX GEL
Freq: Once | CUTANEOUS | Status: AC
  Administered 2017-03-16: 15:00:00 via TOPICAL

## 2017-03-16 MED ORDER — ALRA NON-METALLIC DEODORANT (RAD-ONC)
1.0000 "application " | Freq: Once | TOPICAL | Status: AC
  Administered 2017-03-16: 1 via TOPICAL

## 2017-03-16 NOTE — Progress Notes (Signed)
Pt education done,my business card, radiation therapy and you book, alra,radiaplex given,discussed side effects, fatigue, skin irritation,, short sharp pains in breast,,swelling, , luke warm showers, no rubbing,scrubbing or scratching area treated, pat dry, no shaving unless electric shaver, increase protein in diet and stay hydrated, drink plenty water, no under wire bras, sees MD Fridays and prn, knows to call and leave name ,dob if cancelling treatment for the day, teach back given 2:45 PM

## 2017-03-17 ENCOUNTER — Ambulatory Visit
Admission: RE | Admit: 2017-03-17 | Discharge: 2017-03-17 | Disposition: A | Discharge status: Home / Self Care | Payer: 59 | Source: Ambulatory Visit | Attending: Radiation Oncology | Admitting: Radiation Oncology

## 2017-03-17 DIAGNOSIS — Z510 Encounter for antineoplastic radiation therapy: Secondary | ICD-10-CM | POA: Diagnosis not present

## 2017-03-17 DIAGNOSIS — D0512 Intraductal carcinoma in situ of left breast: Secondary | ICD-10-CM | POA: Diagnosis not present

## 2017-03-18 ENCOUNTER — Ambulatory Visit
Admission: RE | Admit: 2017-03-18 | Discharge: 2017-03-18 | Disposition: A | Discharge status: Home / Self Care | Payer: 59 | Source: Ambulatory Visit | Attending: Radiation Oncology | Admitting: Radiation Oncology

## 2017-03-18 DIAGNOSIS — Z510 Encounter for antineoplastic radiation therapy: Secondary | ICD-10-CM | POA: Diagnosis not present

## 2017-03-18 DIAGNOSIS — D0512 Intraductal carcinoma in situ of left breast: Secondary | ICD-10-CM | POA: Diagnosis not present

## 2017-03-19 ENCOUNTER — Ambulatory Visit
Admission: RE | Admit: 2017-03-19 | Discharge: 2017-03-19 | Disposition: A | Discharge status: Home / Self Care | Payer: 59 | Source: Ambulatory Visit | Attending: Radiation Oncology | Admitting: Radiation Oncology

## 2017-03-19 ENCOUNTER — Ambulatory Visit: Payer: 59 | Admitting: Oncology

## 2017-03-19 DIAGNOSIS — Z510 Encounter for antineoplastic radiation therapy: Secondary | ICD-10-CM | POA: Diagnosis not present

## 2017-03-19 DIAGNOSIS — D0512 Intraductal carcinoma in situ of left breast: Secondary | ICD-10-CM | POA: Diagnosis not present

## 2017-03-22 ENCOUNTER — Ambulatory Visit
Admission: RE | Admit: 2017-03-22 | Discharge: 2017-03-22 | Disposition: A | Discharge status: Home / Self Care | Payer: 59 | Source: Ambulatory Visit | Attending: Radiation Oncology | Admitting: Radiation Oncology

## 2017-03-22 DIAGNOSIS — D0512 Intraductal carcinoma in situ of left breast: Secondary | ICD-10-CM | POA: Diagnosis not present

## 2017-03-22 DIAGNOSIS — Z510 Encounter for antineoplastic radiation therapy: Secondary | ICD-10-CM | POA: Diagnosis not present

## 2017-03-23 ENCOUNTER — Ambulatory Visit
Admission: RE | Admit: 2017-03-23 | Discharge: 2017-03-23 | Disposition: A | Discharge status: Home / Self Care | Payer: 59 | Source: Ambulatory Visit | Attending: Radiation Oncology | Admitting: Radiation Oncology

## 2017-03-23 DIAGNOSIS — Z510 Encounter for antineoplastic radiation therapy: Secondary | ICD-10-CM | POA: Diagnosis not present

## 2017-03-23 DIAGNOSIS — D0512 Intraductal carcinoma in situ of left breast: Secondary | ICD-10-CM | POA: Diagnosis not present

## 2017-03-24 ENCOUNTER — Ambulatory Visit
Admission: RE | Admit: 2017-03-24 | Discharge: 2017-03-24 | Disposition: A | Discharge status: Home / Self Care | Payer: 59 | Source: Ambulatory Visit | Attending: Radiation Oncology | Admitting: Radiation Oncology

## 2017-03-24 DIAGNOSIS — D0512 Intraductal carcinoma in situ of left breast: Secondary | ICD-10-CM | POA: Diagnosis not present

## 2017-03-24 DIAGNOSIS — Z510 Encounter for antineoplastic radiation therapy: Secondary | ICD-10-CM | POA: Diagnosis not present

## 2017-03-25 ENCOUNTER — Ambulatory Visit
Admission: RE | Admit: 2017-03-25 | Discharge: 2017-03-25 | Disposition: A | Discharge status: Home / Self Care | Payer: 59 | Source: Ambulatory Visit | Attending: Radiation Oncology | Admitting: Radiation Oncology

## 2017-03-25 DIAGNOSIS — D0512 Intraductal carcinoma in situ of left breast: Secondary | ICD-10-CM | POA: Diagnosis not present

## 2017-03-25 DIAGNOSIS — Z510 Encounter for antineoplastic radiation therapy: Secondary | ICD-10-CM | POA: Diagnosis not present

## 2017-03-26 ENCOUNTER — Ambulatory Visit
Admission: RE | Admit: 2017-03-26 | Discharge: 2017-03-26 | Disposition: A | Discharge status: Home / Self Care | Payer: 59 | Source: Ambulatory Visit | Attending: Radiation Oncology | Admitting: Radiation Oncology

## 2017-03-26 DIAGNOSIS — Z510 Encounter for antineoplastic radiation therapy: Secondary | ICD-10-CM | POA: Diagnosis not present

## 2017-03-26 DIAGNOSIS — D0512 Intraductal carcinoma in situ of left breast: Secondary | ICD-10-CM | POA: Diagnosis not present

## 2017-03-28 DIAGNOSIS — Z510 Encounter for antineoplastic radiation therapy: Secondary | ICD-10-CM | POA: Diagnosis not present

## 2017-03-28 DIAGNOSIS — D0512 Intraductal carcinoma in situ of left breast: Secondary | ICD-10-CM | POA: Diagnosis not present

## 2017-03-29 ENCOUNTER — Ambulatory Visit
Admission: RE | Admit: 2017-03-29 | Discharge: 2017-03-29 | Disposition: A | Discharge status: Home / Self Care | Payer: 59 | Source: Ambulatory Visit | Attending: Radiation Oncology | Admitting: Radiation Oncology

## 2017-03-29 DIAGNOSIS — Z510 Encounter for antineoplastic radiation therapy: Secondary | ICD-10-CM | POA: Diagnosis not present

## 2017-03-29 DIAGNOSIS — D0512 Intraductal carcinoma in situ of left breast: Secondary | ICD-10-CM | POA: Diagnosis not present

## 2017-03-30 ENCOUNTER — Ambulatory Visit
Admission: RE | Admit: 2017-03-30 | Discharge: 2017-03-30 | Disposition: A | Discharge status: Home / Self Care | Payer: 59 | Source: Ambulatory Visit | Attending: Radiation Oncology | Admitting: Radiation Oncology

## 2017-03-30 DIAGNOSIS — Z510 Encounter for antineoplastic radiation therapy: Secondary | ICD-10-CM | POA: Diagnosis not present

## 2017-03-30 DIAGNOSIS — D0512 Intraductal carcinoma in situ of left breast: Secondary | ICD-10-CM | POA: Diagnosis not present

## 2017-04-01 ENCOUNTER — Ambulatory Visit
Admission: RE | Admit: 2017-04-01 | Discharge: 2017-04-01 | Disposition: A | Discharge status: Home / Self Care | Payer: 59 | Source: Ambulatory Visit | Attending: Radiation Oncology | Admitting: Radiation Oncology

## 2017-04-01 DIAGNOSIS — Z510 Encounter for antineoplastic radiation therapy: Secondary | ICD-10-CM | POA: Diagnosis not present

## 2017-04-01 DIAGNOSIS — D0512 Intraductal carcinoma in situ of left breast: Secondary | ICD-10-CM | POA: Diagnosis not present

## 2017-04-02 ENCOUNTER — Ambulatory Visit
Admission: RE | Admit: 2017-04-02 | Discharge: 2017-04-02 | Disposition: A | Discharge status: Home / Self Care | Payer: 59 | Source: Ambulatory Visit | Attending: Radiation Oncology | Admitting: Radiation Oncology

## 2017-04-02 DIAGNOSIS — D0512 Intraductal carcinoma in situ of left breast: Secondary | ICD-10-CM | POA: Diagnosis not present

## 2017-04-02 DIAGNOSIS — Z510 Encounter for antineoplastic radiation therapy: Secondary | ICD-10-CM | POA: Diagnosis not present

## 2017-04-05 ENCOUNTER — Ambulatory Visit
Admission: RE | Admit: 2017-04-05 | Discharge: 2017-04-05 | Disposition: A | Discharge status: Home / Self Care | Payer: 59 | Source: Ambulatory Visit | Attending: Radiation Oncology | Admitting: Radiation Oncology

## 2017-04-05 DIAGNOSIS — Z510 Encounter for antineoplastic radiation therapy: Secondary | ICD-10-CM | POA: Diagnosis not present

## 2017-04-05 DIAGNOSIS — D0512 Intraductal carcinoma in situ of left breast: Secondary | ICD-10-CM | POA: Diagnosis not present

## 2017-04-06 ENCOUNTER — Ambulatory Visit
Admission: RE | Admit: 2017-04-06 | Discharge: 2017-04-06 | Disposition: A | Discharge status: Home / Self Care | Payer: 59 | Source: Ambulatory Visit | Attending: Radiation Oncology | Admitting: Radiation Oncology

## 2017-04-06 DIAGNOSIS — D0512 Intraductal carcinoma in situ of left breast: Secondary | ICD-10-CM | POA: Diagnosis not present

## 2017-04-06 DIAGNOSIS — Z510 Encounter for antineoplastic radiation therapy: Secondary | ICD-10-CM | POA: Diagnosis not present

## 2017-04-07 ENCOUNTER — Ambulatory Visit
Admission: RE | Admit: 2017-04-07 | Discharge: 2017-04-07 | Disposition: A | Discharge status: Home / Self Care | Payer: 59 | Source: Ambulatory Visit | Attending: Radiation Oncology | Admitting: Radiation Oncology

## 2017-04-07 DIAGNOSIS — Z510 Encounter for antineoplastic radiation therapy: Secondary | ICD-10-CM | POA: Diagnosis not present

## 2017-04-07 DIAGNOSIS — D0512 Intraductal carcinoma in situ of left breast: Secondary | ICD-10-CM | POA: Diagnosis not present

## 2017-04-08 ENCOUNTER — Ambulatory Visit
Admission: RE | Admit: 2017-04-08 | Discharge: 2017-04-08 | Disposition: A | Discharge status: Home / Self Care | Payer: 59 | Source: Ambulatory Visit | Attending: Radiation Oncology | Admitting: Radiation Oncology

## 2017-04-08 DIAGNOSIS — D0512 Intraductal carcinoma in situ of left breast: Secondary | ICD-10-CM | POA: Diagnosis not present

## 2017-04-08 DIAGNOSIS — Z510 Encounter for antineoplastic radiation therapy: Secondary | ICD-10-CM | POA: Diagnosis not present

## 2017-04-09 ENCOUNTER — Ambulatory Visit
Admission: RE | Admit: 2017-04-09 | Discharge: 2017-04-09 | Disposition: A | Discharge status: Home / Self Care | Payer: 59 | Source: Ambulatory Visit | Attending: Radiation Oncology | Admitting: Radiation Oncology

## 2017-04-09 DIAGNOSIS — L820 Inflamed seborrheic keratosis: Secondary | ICD-10-CM | POA: Diagnosis not present

## 2017-04-09 DIAGNOSIS — Z510 Encounter for antineoplastic radiation therapy: Secondary | ICD-10-CM | POA: Diagnosis not present

## 2017-04-09 DIAGNOSIS — D0512 Intraductal carcinoma in situ of left breast: Secondary | ICD-10-CM | POA: Diagnosis not present

## 2017-04-09 DIAGNOSIS — L821 Other seborrheic keratosis: Secondary | ICD-10-CM | POA: Diagnosis not present

## 2017-04-09 DIAGNOSIS — R202 Paresthesia of skin: Secondary | ICD-10-CM | POA: Diagnosis not present

## 2017-04-12 ENCOUNTER — Ambulatory Visit
Admission: RE | Admit: 2017-04-12 | Discharge: 2017-04-12 | Disposition: A | Discharge status: Home / Self Care | Payer: 59 | Source: Ambulatory Visit | Attending: Radiation Oncology | Admitting: Radiation Oncology

## 2017-04-12 DIAGNOSIS — D0512 Intraductal carcinoma in situ of left breast: Secondary | ICD-10-CM | POA: Diagnosis not present

## 2017-04-12 DIAGNOSIS — Z510 Encounter for antineoplastic radiation therapy: Secondary | ICD-10-CM | POA: Diagnosis not present

## 2017-04-13 ENCOUNTER — Ambulatory Visit
Admission: RE | Admit: 2017-04-13 | Discharge: 2017-04-13 | Disposition: A | Discharge status: Home / Self Care | Payer: 59 | Source: Ambulatory Visit | Attending: Radiation Oncology | Admitting: Radiation Oncology

## 2017-04-13 DIAGNOSIS — D0512 Intraductal carcinoma in situ of left breast: Secondary | ICD-10-CM | POA: Diagnosis not present

## 2017-04-13 DIAGNOSIS — Z510 Encounter for antineoplastic radiation therapy: Secondary | ICD-10-CM | POA: Diagnosis not present

## 2017-04-14 ENCOUNTER — Ambulatory Visit
Admission: RE | Admit: 2017-04-14 | Discharge: 2017-04-14 | Disposition: A | Discharge status: Home / Self Care | Payer: 59 | Source: Ambulatory Visit | Attending: Radiation Oncology | Admitting: Radiation Oncology

## 2017-04-14 DIAGNOSIS — D0512 Intraductal carcinoma in situ of left breast: Secondary | ICD-10-CM | POA: Diagnosis not present

## 2017-04-14 DIAGNOSIS — Z510 Encounter for antineoplastic radiation therapy: Secondary | ICD-10-CM | POA: Diagnosis not present

## 2017-04-15 ENCOUNTER — Ambulatory Visit
Admission: RE | Admit: 2017-04-15 | Discharge: 2017-04-15 | Disposition: A | Discharge status: Home / Self Care | Payer: 59 | Source: Ambulatory Visit | Attending: Radiation Oncology | Admitting: Radiation Oncology

## 2017-04-15 DIAGNOSIS — Z510 Encounter for antineoplastic radiation therapy: Secondary | ICD-10-CM | POA: Diagnosis not present

## 2017-04-15 DIAGNOSIS — D0512 Intraductal carcinoma in situ of left breast: Secondary | ICD-10-CM | POA: Diagnosis not present

## 2017-04-16 ENCOUNTER — Ambulatory Visit: Payer: 59

## 2017-04-19 ENCOUNTER — Ambulatory Visit
Admission: RE | Admit: 2017-04-19 | Discharge: 2017-04-19 | Disposition: A | Discharge status: Home / Self Care | Payer: 59 | Source: Ambulatory Visit | Attending: Radiation Oncology | Admitting: Radiation Oncology

## 2017-04-19 DIAGNOSIS — D0512 Intraductal carcinoma in situ of left breast: Secondary | ICD-10-CM | POA: Diagnosis not present

## 2017-04-19 DIAGNOSIS — Z510 Encounter for antineoplastic radiation therapy: Secondary | ICD-10-CM | POA: Diagnosis not present

## 2017-04-20 ENCOUNTER — Ambulatory Visit
Admission: RE | Admit: 2017-04-20 | Discharge: 2017-04-20 | Disposition: A | Discharge status: Home / Self Care | Payer: 59 | Source: Ambulatory Visit | Attending: Radiation Oncology | Admitting: Radiation Oncology

## 2017-04-20 DIAGNOSIS — D0512 Intraductal carcinoma in situ of left breast: Secondary | ICD-10-CM | POA: Diagnosis not present

## 2017-04-20 DIAGNOSIS — Z510 Encounter for antineoplastic radiation therapy: Secondary | ICD-10-CM | POA: Diagnosis not present

## 2017-04-21 ENCOUNTER — Ambulatory Visit
Admission: RE | Admit: 2017-04-21 | Discharge: 2017-04-21 | Disposition: A | Discharge status: Home / Self Care | Payer: 59 | Source: Ambulatory Visit | Attending: Radiation Oncology | Admitting: Radiation Oncology

## 2017-04-21 DIAGNOSIS — R202 Paresthesia of skin: Secondary | ICD-10-CM | POA: Diagnosis not present

## 2017-04-21 DIAGNOSIS — Z510 Encounter for antineoplastic radiation therapy: Secondary | ICD-10-CM | POA: Diagnosis not present

## 2017-04-21 DIAGNOSIS — D0512 Intraductal carcinoma in situ of left breast: Secondary | ICD-10-CM | POA: Diagnosis not present

## 2017-04-21 DIAGNOSIS — L820 Inflamed seborrheic keratosis: Secondary | ICD-10-CM | POA: Diagnosis not present

## 2017-04-21 DIAGNOSIS — L821 Other seborrheic keratosis: Secondary | ICD-10-CM | POA: Diagnosis not present

## 2017-04-22 ENCOUNTER — Ambulatory Visit: Payer: 59 | Admitting: Radiation Oncology

## 2017-04-22 ENCOUNTER — Ambulatory Visit
Admission: RE | Admit: 2017-04-22 | Discharge: 2017-04-22 | Disposition: A | Discharge status: Home / Self Care | Payer: 59 | Source: Ambulatory Visit | Attending: Radiation Oncology | Admitting: Radiation Oncology

## 2017-04-22 DIAGNOSIS — D0512 Intraductal carcinoma in situ of left breast: Secondary | ICD-10-CM | POA: Diagnosis not present

## 2017-04-22 DIAGNOSIS — Z510 Encounter for antineoplastic radiation therapy: Secondary | ICD-10-CM | POA: Diagnosis not present

## 2017-04-23 ENCOUNTER — Ambulatory Visit
Admission: RE | Admit: 2017-04-23 | Discharge: 2017-04-23 | Disposition: A | Discharge status: Home / Self Care | Payer: 59 | Source: Ambulatory Visit | Attending: Radiation Oncology | Admitting: Radiation Oncology

## 2017-04-23 DIAGNOSIS — Z510 Encounter for antineoplastic radiation therapy: Secondary | ICD-10-CM | POA: Diagnosis not present

## 2017-04-23 DIAGNOSIS — D0512 Intraductal carcinoma in situ of left breast: Secondary | ICD-10-CM | POA: Diagnosis not present

## 2017-04-25 ENCOUNTER — Ambulatory Visit: Payer: 59

## 2017-04-26 ENCOUNTER — Ambulatory Visit
Admission: RE | Admit: 2017-04-26 | Discharge: 2017-04-26 | Disposition: A | Discharge status: Home / Self Care | Payer: 59 | Source: Ambulatory Visit | Attending: Radiation Oncology | Admitting: Radiation Oncology

## 2017-04-26 DIAGNOSIS — D0512 Intraductal carcinoma in situ of left breast: Secondary | ICD-10-CM | POA: Diagnosis not present

## 2017-04-26 DIAGNOSIS — Z170 Estrogen receptor positive status [ER+]: Secondary | ICD-10-CM | POA: Diagnosis not present

## 2017-04-26 DIAGNOSIS — Z510 Encounter for antineoplastic radiation therapy: Secondary | ICD-10-CM | POA: Diagnosis not present

## 2017-04-26 IMAGING — MG MM CLIP PLACEMENT
2 series · 2 of 2 positions shown · non-contrast
Comparison: Previous exam(s).

CLINICAL DATA: Confirmation of clip placement after stereotactic
tomosynthesis core needle biopsy of 2 separate groups of
calcifications in the left breast, loosely grouped calcifications in
the lower outer quadrant spanning approximately 1.5 cm and a 4 mm
group of amorphous calcifications in the upper outer quadrant.

EXAM:
DIAGNOSTIC LEFT MAMMOGRAM POST STEREOTACTIC BIOPSY

[L LM]
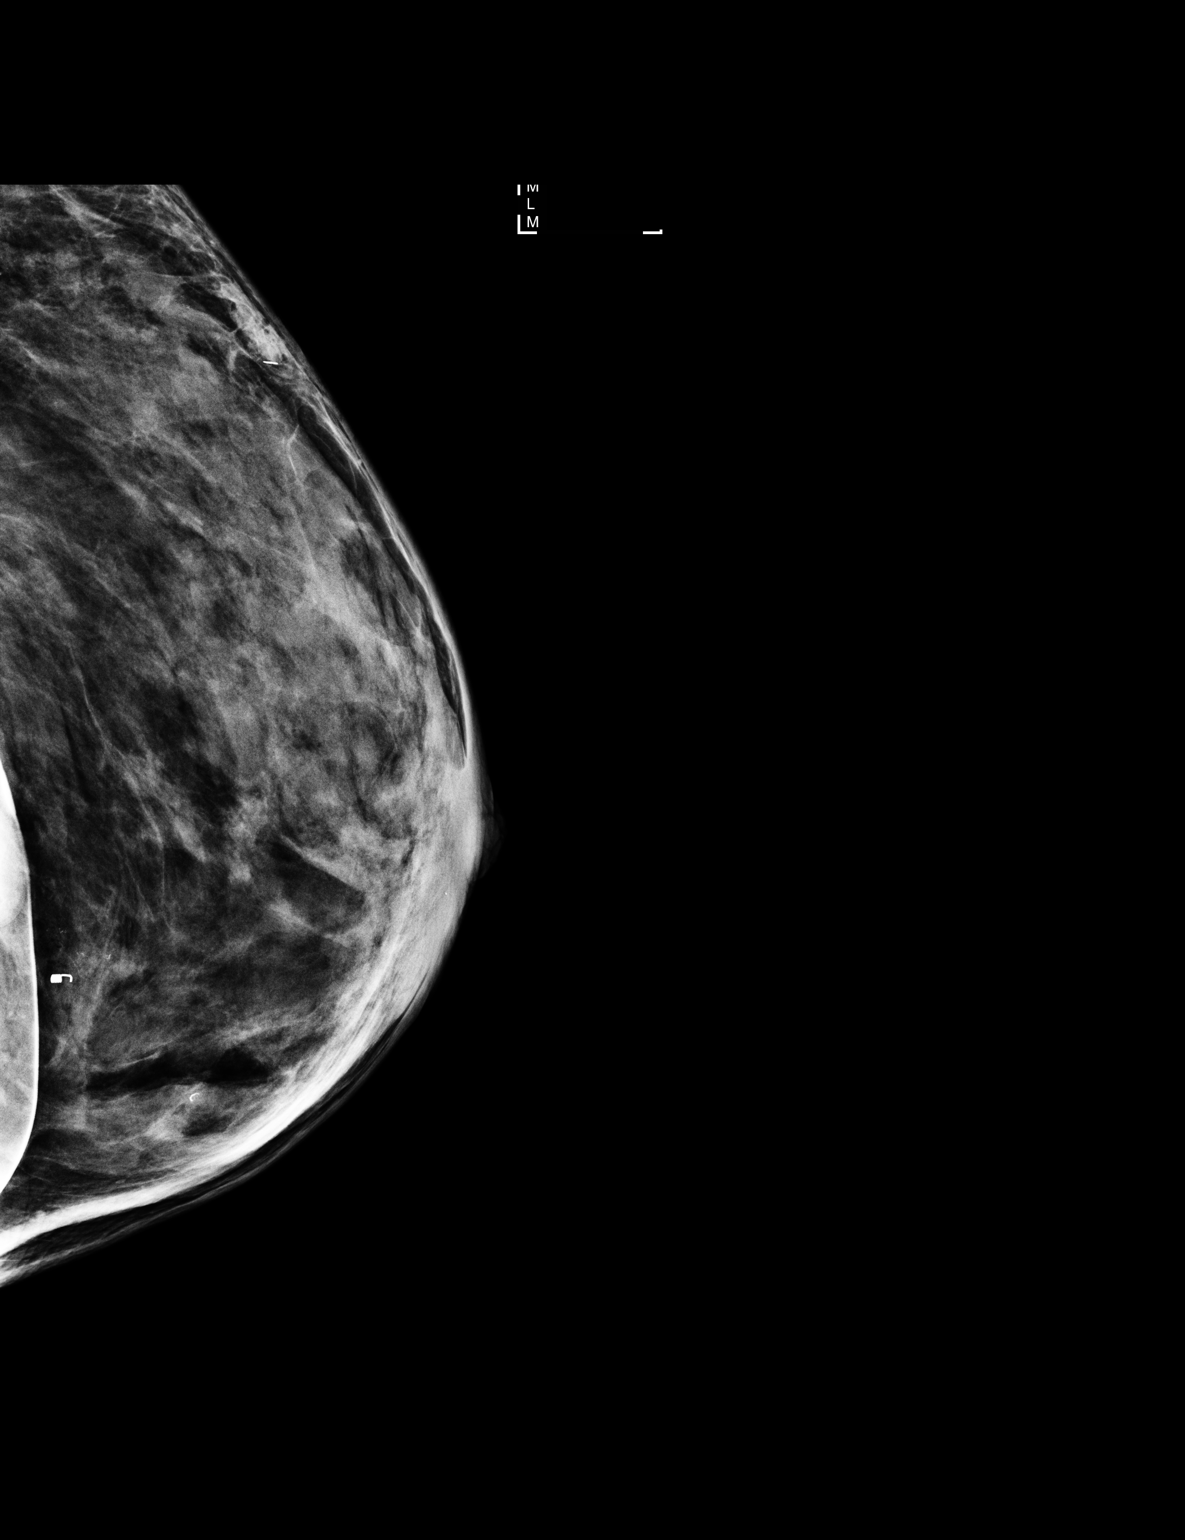

[L CC]
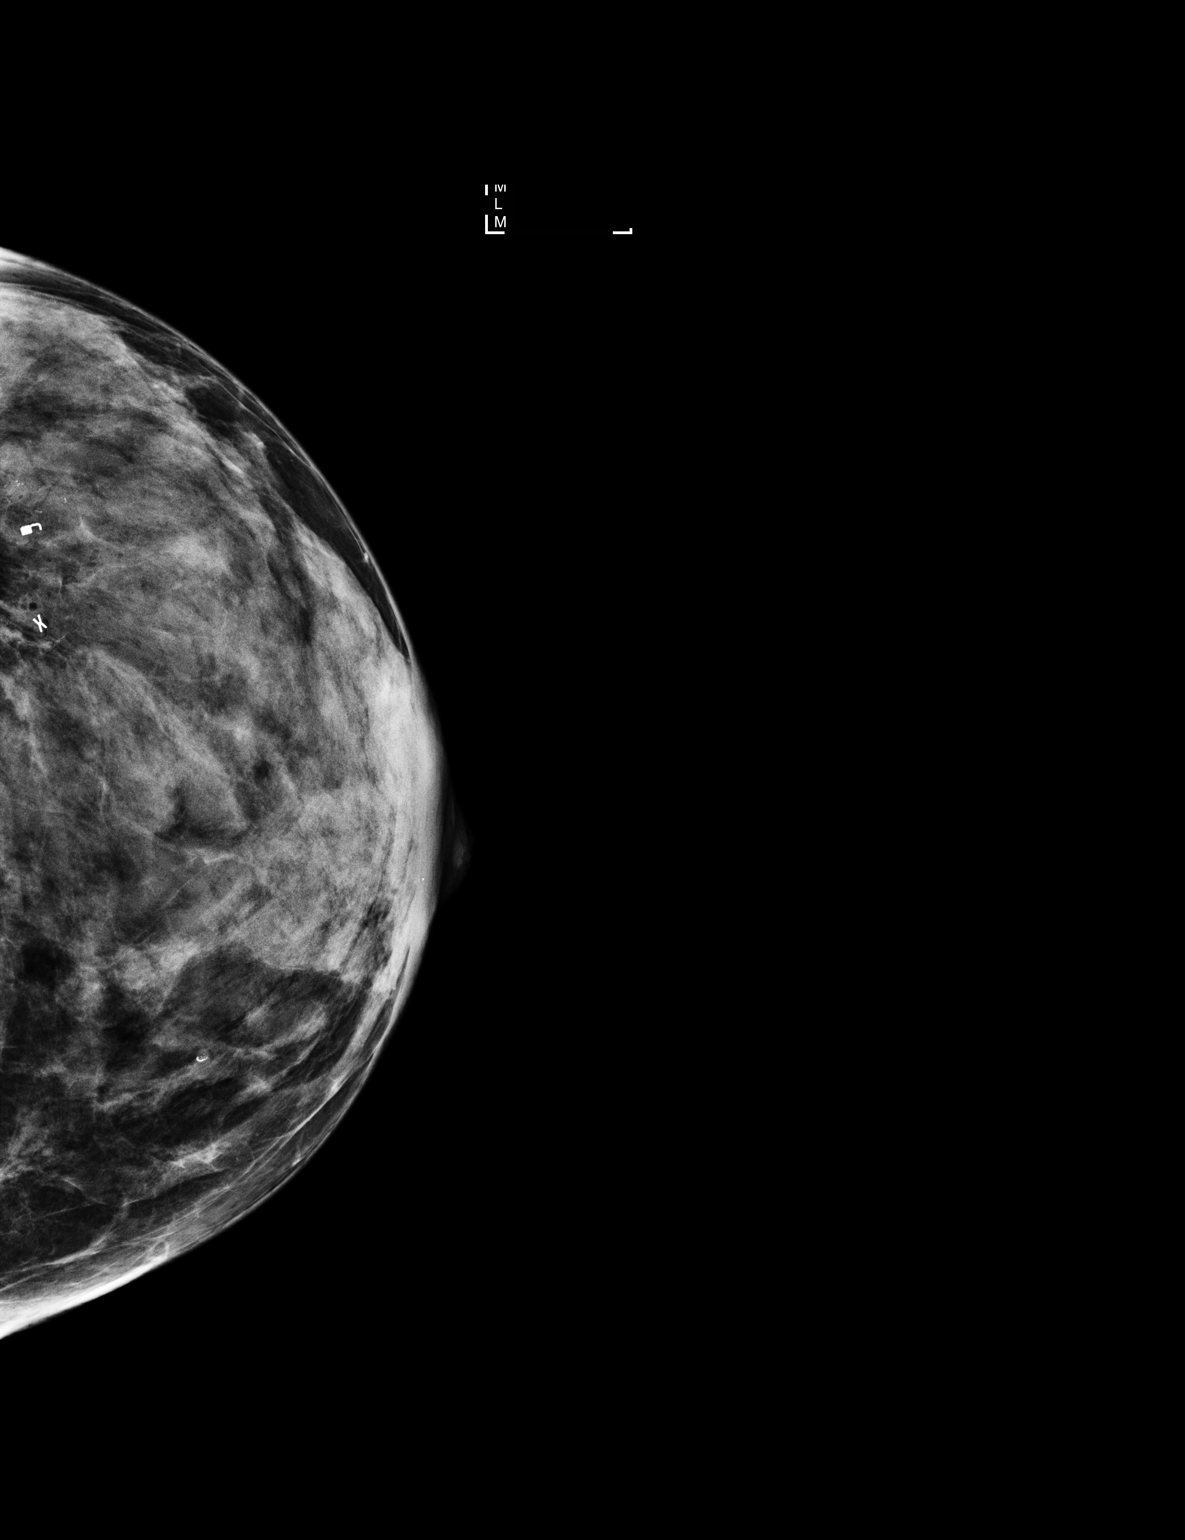

[2 of 2 positions shown; findings below may reference images not displayed]

FINDINGS: Mammographic images were obtained following stereotactic
tomosynthesis guided biopsy of 2 groups of calcifications in the
left breast.

The coil shaped tissue marker clip is appropriately positioned at
the site of the biopsied group of calcifications in the lower outer
quadrant of the left breast at posterior depth. Calcifications from
the inferomedial portion of the group were sampled and the clip is
positioned at the site of the biopsied calcifications.

The X shaped tissue marker clip is appropriately positioned at the
site of the biopsied faint amorphous calcifications in the upper
outer quadrant of the left breast at posterior depth.

Expected post biopsy changes are present at both sites without
evidence of hematoma.
IMPRESSION: 1. Appropriate positioning of the coil shaped tissue marker clip at
the site of the biopsy group of calcifications in the lower outer
quadrant of the left breast.
2. Appropriate positioning of the X shaped tissue marker clip at the
site of the biopsied faint amorphous calcifications in the upper
outer quadrant of the left breast.

Final Assessment: Post Procedure Mammograms for Marker Placement

## 2017-04-27 ENCOUNTER — Ambulatory Visit
Admission: RE | Admit: 2017-04-27 | Discharge: 2017-04-27 | Disposition: A | Discharge status: Home / Self Care | Payer: 59 | Source: Ambulatory Visit | Attending: Radiation Oncology | Admitting: Radiation Oncology

## 2017-04-27 ENCOUNTER — Ambulatory Visit: Payer: 59

## 2017-04-27 DIAGNOSIS — Z803 Family history of malignant neoplasm of breast: Secondary | ICD-10-CM | POA: Diagnosis not present

## 2017-04-27 DIAGNOSIS — D0512 Intraductal carcinoma in situ of left breast: Secondary | ICD-10-CM | POA: Diagnosis not present

## 2017-04-27 DIAGNOSIS — Z79899 Other long term (current) drug therapy: Secondary | ICD-10-CM | POA: Insufficient documentation

## 2017-04-27 DIAGNOSIS — Z888 Allergy status to other drugs, medicaments and biological substances status: Secondary | ICD-10-CM | POA: Diagnosis not present

## 2017-04-27 DIAGNOSIS — Z885 Allergy status to narcotic agent status: Secondary | ICD-10-CM | POA: Insufficient documentation

## 2017-04-27 DIAGNOSIS — F17210 Nicotine dependence, cigarettes, uncomplicated: Secondary | ICD-10-CM | POA: Diagnosis not present

## 2017-04-27 DIAGNOSIS — Z170 Estrogen receptor positive status [ER+]: Secondary | ICD-10-CM | POA: Diagnosis not present

## 2017-04-27 DIAGNOSIS — I10 Essential (primary) hypertension: Secondary | ICD-10-CM | POA: Insufficient documentation

## 2017-04-27 DIAGNOSIS — F419 Anxiety disorder, unspecified: Secondary | ICD-10-CM | POA: Diagnosis not present

## 2017-04-27 DIAGNOSIS — Z510 Encounter for antineoplastic radiation therapy: Secondary | ICD-10-CM | POA: Insufficient documentation

## 2017-04-28 ENCOUNTER — Ambulatory Visit
Admission: RE | Admit: 2017-04-28 | Discharge: 2017-04-28 | Disposition: A | Discharge status: Home / Self Care | Payer: 59 | Source: Ambulatory Visit | Attending: Radiation Oncology | Admitting: Radiation Oncology

## 2017-04-28 ENCOUNTER — Ambulatory Visit: Payer: 59

## 2017-04-28 DIAGNOSIS — D0512 Intraductal carcinoma in situ of left breast: Secondary | ICD-10-CM | POA: Diagnosis not present

## 2017-04-28 DIAGNOSIS — Z510 Encounter for antineoplastic radiation therapy: Secondary | ICD-10-CM | POA: Diagnosis not present

## 2017-04-29 ENCOUNTER — Ambulatory Visit
Admission: RE | Admit: 2017-04-29 | Discharge: 2017-04-29 | Disposition: A | Discharge status: Home / Self Care | Payer: 59 | Source: Ambulatory Visit | Attending: Radiation Oncology | Admitting: Radiation Oncology

## 2017-04-29 DIAGNOSIS — D0512 Intraductal carcinoma in situ of left breast: Secondary | ICD-10-CM | POA: Diagnosis not present

## 2017-04-29 DIAGNOSIS — Z510 Encounter for antineoplastic radiation therapy: Secondary | ICD-10-CM | POA: Diagnosis not present

## 2017-04-30 ENCOUNTER — Ambulatory Visit
Admission: RE | Admit: 2017-04-30 | Discharge: 2017-04-30 | Disposition: A | Discharge status: Home / Self Care | Payer: 59 | Source: Ambulatory Visit | Attending: Radiation Oncology | Admitting: Radiation Oncology

## 2017-04-30 DIAGNOSIS — Z510 Encounter for antineoplastic radiation therapy: Secondary | ICD-10-CM | POA: Diagnosis not present

## 2017-04-30 DIAGNOSIS — D0512 Intraductal carcinoma in situ of left breast: Secondary | ICD-10-CM | POA: Diagnosis not present

## 2017-05-03 ENCOUNTER — Ambulatory Visit: Payer: 59

## 2017-05-03 ENCOUNTER — Ambulatory Visit
Admission: RE | Admit: 2017-05-03 | Discharge: 2017-05-03 | Disposition: A | Discharge status: Home / Self Care | Payer: 59 | Source: Ambulatory Visit | Attending: Radiation Oncology | Admitting: Radiation Oncology

## 2017-05-03 DIAGNOSIS — Z510 Encounter for antineoplastic radiation therapy: Secondary | ICD-10-CM | POA: Diagnosis not present

## 2017-05-03 DIAGNOSIS — D0512 Intraductal carcinoma in situ of left breast: Secondary | ICD-10-CM | POA: Diagnosis not present

## 2017-05-04 ENCOUNTER — Encounter: Payer: Self-pay | Admitting: Radiation Oncology

## 2017-05-04 ENCOUNTER — Encounter: Payer: Self-pay | Admitting: *Deleted

## 2017-05-04 ENCOUNTER — Ambulatory Visit
Admission: RE | Admit: 2017-05-04 | Discharge: 2017-05-04 | Disposition: A | Discharge status: Home / Self Care | Payer: 59 | Source: Ambulatory Visit | Attending: Radiation Oncology | Admitting: Radiation Oncology

## 2017-05-04 DIAGNOSIS — Z510 Encounter for antineoplastic radiation therapy: Secondary | ICD-10-CM | POA: Diagnosis not present

## 2017-05-04 DIAGNOSIS — D0512 Intraductal carcinoma in situ of left breast: Secondary | ICD-10-CM | POA: Diagnosis not present

## 2017-05-10 ENCOUNTER — Ambulatory Visit (HOSPITAL_BASED_OUTPATIENT_CLINIC_OR_DEPARTMENT_OTHER): Payer: 59 | Admitting: Oncology

## 2017-05-10 VITALS — BP 110/41 | HR 65 | Temp 98.3°F | Resp 18 | Ht 63.0 in | Wt 107.0 lb

## 2017-05-10 DIAGNOSIS — Z170 Estrogen receptor positive status [ER+]: Secondary | ICD-10-CM

## 2017-05-10 DIAGNOSIS — Z720 Tobacco use: Secondary | ICD-10-CM

## 2017-05-10 DIAGNOSIS — C50512 Malignant neoplasm of lower-outer quadrant of left female breast: Secondary | ICD-10-CM

## 2017-05-10 DIAGNOSIS — D6489 Other specified anemias: Secondary | ICD-10-CM

## 2017-05-10 DIAGNOSIS — D0512 Intraductal carcinoma in situ of left breast: Secondary | ICD-10-CM | POA: Diagnosis not present

## 2017-05-10 MED ORDER — TAMOXIFEN CITRATE 20 MG PO TABS: 20.0000 mg | ORAL_TABLET | Freq: Every day | ORAL | 12 refills | Status: AC

## 2017-05-10 NOTE — Progress Notes (Signed)
[name]  Telephone:(336) [phone] Fax:(336) [phone]     ID: Toni Bowers DOB: [birth year]  MR#: [id number]  QIH#:[id number]  Patient Care Team: Darcus Austin, MD as PCP - General (Family Medicine) Rolm Bookbinder, MD as Consulting Physician (General Surgery) Isidro Monks, Virgie Dad, MD as Consulting Physician (Oncology) Kyung Rudd, MD as Consulting Physician (Radiation Oncology) [place], Jennefer Bravo, MD as Referring Physician (Dermatology) Chauncey Cruel, MD OTHER MD:  CHIEF COMPLAINT: Ductal carcinoma in situ  CURRENT TREATMENT:    BREAST CANCER HISTORY: From the original intake note:  Toni Bowers had routine bilateral screening mammography with tomography at the [facility] 01/05/2017. The breast density was category D. The patient has retropectoral implants in place. There was a possible mass or area of distortion in the right breast and on 01/12/2017 the patient underwent right diagnostic mammography with tomography and ultrasonography. This time the breast density was read as category C.   In the upper outer right breast there was an area of persistent distortion. This was not palpable. This area was sonographically silent and there was no right axillary adenopathy.  In the left breast however there were 2 separate groups of calcifications 1 measuring 0.4 cm in the upper outer quadrant and one measuring 1.0 cm in the lateral left breast.  On 01/19/2017 the patient underwent biopsy of the suspicious area in the upper-outer quadrant of the right breast and this showed ([id number]) a complex sclerosing lesion. On the same day in the left breast the patient had biopsies of the 2 areas of calcification of concern. The one in the upper-outer quadrant was benign. The one in the lower outer quadrant showed ductal carcinoma in situ, high-grade, estrogen receptor positive at 95%, and progesterone receptor positive at 70%, both with strong staining intensity.  Her  subsequent history is as detailed below   INTERVAL HISTORY: Toni Bowers returns today for follow-up and treatment of her ductal carcinoma in situ. Since her last visit here she had a right lumpectomy on 02/15/2017 which showed only a complex sclerosing lesion. On the left however a double lumpectomy on the same day showed in the left lower outer quadrant ductal carcinoma in situ measuring 1.5 mm, with negative margins.  She then proceeded to adjuvant radiation treatments, completed 05/04/2017  She is here today to consider adjuvant anti-estrogens  REVIEW OF SYSTEMS: Solara did well with her surgery, without any unusual pain, fever, or bleeding. She also did remarkably well with radiation. She says fatigue was minimal. Her skin held up well until the last week but the desquamation she experiences pretty much already resolved. Aside from these issues a detailed review of systems today was stable  PAST MEDICAL HISTORY: Past Medical History:  Diagnosis Date  . Anxiety   . Chronic constipation   . Hypertension   . Internal prolapsed hemorrhoids    and bleeding  . Lactose intolerance   . Wears glasses     PAST SURGICAL HISTORY: Past Surgical History:  Procedure Laterality Date  . AUGMENTATION MAMMAPLASTY Bilateral 1999   retro-pectoral saline  . BREAST IMPLANT EXCHANGE  01/  2016  . BREAST LUMPECTOMY WITH RADIOACTIVE SEED LOCALIZATION Left 02/15/2017   Procedure: LEFT BREAST LUMPECTOMY WITH RADIOACTIVE SEED LOCALIZATION X 2;  Surgeon: Rolm Bookbinder, MD;  Location: [facility];  Service: General;  Laterality: Left;  . HEMORRHOID SURGERY N/A 10/17/2015   Procedure: HEMORRHOIDECTOMY;  Surgeon: Jackolyn Confer, MD;  Location: [facility];  Service: General;  Laterality: N/A;  . PLACEMENT  OF BREAST IMPLANTS Bilateral 1999  . RADIOACTIVE SEED GUIDED EXCISIONAL BREAST BIOPSY Right 02/15/2017   Procedure: RIGHT BREAST SEED GUIDED EXCISIONAL BREAST BIOPSY;  Surgeon:  Rolm Bookbinder, MD;  Location: [facility];  Service: General;  Laterality: Right;    FAMILY HISTORY Family History  Problem Relation Age of Onset  . Breast cancer Mother   The patient's mother, Toni Bowers, died from breast cancer, recurrent, in 2013 at age 23. The patient's father is still living at age 9 as of May 2018. The patient has one brother, no sisters. There is no other history of breast or ovarian cancer in the family.  GYNECOLOGIC HISTORY:  No LMP recorded. Menarche age 88, the patient is GX P0. She stopped having periods October 2017 but then they resumed March 2018 and came right on time in April 2018.  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 Her husband Marcello Moores runs the [employer] out of [place]. At home is just the 2 of them plus 2 chihuahuas    ADVANCED DIRECTIVES: In place   HEALTH MAINTENANCE: Social History  Substance Use Topics  . Smoking status: Current Every Day Smoker    Packs/day: 1.00    Years: 30.00    Types: Cigarettes  . Smokeless tobacco: Never Used  . Alcohol use No     Colonoscopy: October 2016  PAP:  Bone density: 2004?   Allergies  Allergen Reactions  . Codeine Nausea And Vomiting  . Metoprolol Succinate [Metoprolol] Swelling    Throat swelling  . Zoloft [Sertraline Hcl] Swelling    "throat closes"  . Adhesive [Tape] Itching, Swelling and Rash    Current Outpatient Prescriptions  Medication Sig Dispense Refill  . azelastine (ASTELIN) 0.1 % nasal spray Place 1 spray into both nostrils 2 (two) times daily. Use in each nostril as directed    . Biotin 5000 MCG CAPS Take 1 capsule by mouth every morning.    . busPIRone (BUSPAR) 10 MG tablet Take 10 mg by mouth daily.     . Calcium Carbonate-Vitamin D (CALCIUM 600+D) 600-400 MG-UNIT tablet Take 1 tablet by mouth daily.    Marland Kitchen ibuprofen (ADVIL,MOTRIN) 200 MG tablet Take 800 mg by mouth every 6 (six) hours as  needed.    Marland Kitchen lisinopril (PRINIVIL,ZESTRIL) 5 MG tablet Take 5 mg by mouth daily.    . Multiple Vitamin (MULTIVITAMIN) tablet Take 1 tablet by mouth daily.    . Omega-3 Fatty Acids (FISH OIL) 1200 MG CAPS Take 1 capsule by mouth daily.    . ondansetron (ZOFRAN) 4 MG tablet Take 1 tablet (4 mg total) by mouth every 4 (four) hours as needed for nausea. (Patient not taking: Reported on 01/27/2017) 30 tablet 0  . polycarbophil (FIBERCON) 625 MG tablet Take 625 mg by mouth 2 (two) times daily.    . tamoxifen (NOLVADEX) 20 MG tablet Take 1 tablet (20 mg total) by mouth daily. 90 tablet 12  . vitamin B-12 (CYANOCOBALAMIN) 1000 MCG tablet Take 1,000 mcg by mouth daily.     No current facility-administered medications for this visit.     OBJECTIVE: Middle-aged white woman Who appears well  Vitals:   05/10/17 1219  BP: (!) 110/41  Pulse: 65  Resp: 18  Temp: 98.3 F (36.8 C)  SpO2: 99%     Body mass index is 18.95 kg/m.    ECOG FS:0 - Asymptomatic  Sclerae unicteric, pupils round and equal Oropharynx clear  and moist No cervical or supraclavicular adenopathy Lungs no rales or rhonchi Heart regular rate and rhythm Abd soft, nontender, positive bowel sounds MSK no focal spinal tenderness, no upper extremity lymphedema Neuro: nonfocal, well oriented, appropriate affect Breasts: Status post bilateral lumpectomies, with a double lumpectomy on the left. There is no suspicious finding or evidence of disease recurrence. She has bilateral implants in place. Both axillae are benign.  LAB RESULTS:  CMP     Component Value Date/Time   NA 143 01/27/2017 0852   K 4.1 01/27/2017 0852   CL 102 10/14/2015 1258   CO2 30 (H) 01/27/2017 0852   GLUCOSE 86 01/27/2017 0852   BUN 9.1 01/27/2017 0852   CREATININE 0.8 01/27/2017 0852   CALCIUM 10.8 (H) 01/27/2017 0852   PROT 7.4 01/27/2017 0852   ALBUMIN 4.4 01/27/2017 0852   AST 24 01/27/2017 0852   ALT 21 01/27/2017 0852   ALKPHOS 70 01/27/2017 0852    BILITOT 0.51 01/27/2017 0852   GFRNONAA >60 10/14/2015 1258   GFRAA >60 10/14/2015 1258    No results found for: TOTALPROTELP, ALBUMINELP, A1GS, A2GS, BETS, BETA2SER, GAMS, MSPIKE, SPEI  No results found for: Nils Pyle, [facility]  Lab Results  Component Value Date   WBC 6.9 01/27/2017   NEUTROABS 3.9 01/27/2017   HGB 14.7 01/27/2017   HCT 41.9 01/27/2017   MCV 96.8 01/27/2017   PLT 233 01/27/2017      Chemistry      Component Value Date/Time   NA 143 01/27/2017 0852   K 4.1 01/27/2017 0852   CL 102 10/14/2015 1258   CO2 30 (H) 01/27/2017 0852   BUN 9.1 01/27/2017 0852   CREATININE 0.8 01/27/2017 0852      Component Value Date/Time   CALCIUM 10.8 (H) 01/27/2017 0852   ALKPHOS 70 01/27/2017 0852   AST 24 01/27/2017 0852   ALT 21 01/27/2017 0852   BILITOT 0.51 01/27/2017 0852       No results found for: LABCA2  No components found for: [id number]  No results for input(s): INR in the last 168 hours.  Urinalysis No results found for: COLORURINE, APPEARANCEUR, LABSPEC, PHURINE, GLUCOSEU, HGBUR, BILIRUBINUR, KETONESUR, PROTEINUR, UROBILINOGEN, NITRITE, LEUKOCYTESUR   STUDIES: Baseline screening mammography was 01/05/2017 this year, surgical specimen mammography was 02/15/2017. I do not find a recent bone density scan. The patient tells me in her 23s she was told she had "thin bones".  ELIGIBLE FOR AVAILABLE RESEARCH PROTOCOL: no  ASSESSMENT: 51 y.o. [place], [place] woman status post left breast lower outer quadrant biopsy 01/19/2017 for ductal carcinoma in situ, grade 3, estrogen and progesterone receptor positive  (1) right breast upper outer quadrant biopsy 01/19/2017 shows a complex sclerosing lesion  (2) Bilateral lumpectomies 02/15/2017 showed  (a) on the right, only a complex sclerosing lesion  (b) in the left lower outer quadrant ductal carcinoma in situ measuring 1.5 cm, with negative margins  (3) adjuvant radiation completed  05/04/2017  (4) tamoxifen started 05/10/2017  PLAN: Toni Bowers has completed local treatment for her breast cancer and is now ready to consider anti-estrogens. She understands in the case of noninvasive breast cancer, which is her situation, the chief purpose of anti-estrogens is prophylaxis. She has already largely dealt with her ductal carcinoma in situ and her risk of local recurrence is going to be in the less than 10% range.  Of course anti-estrogens would cut that risk in half but more importantly they would cut the risk of her developing another breast  cancer in either breast in half. That risk approaches 1% per year.  We discussed the difference between tamoxifen and anastrozole in detail. She understands that anastrozole and the aromatase inhibitors in general work by blocking estrogen production. Accordingly vaginal dryness, decrease in bone density, and of course hot flashes can result. The aromatase inhibitors can also negatively affect the cholesterol profile, although that is a minor effect. One out of 5 women on aromatase inhibitors we will feel "old and achy". This arthralgia/myalgia syndrome, which resembles fibromyalgia clinically, does resolve with stopping the medications. Accordingly this is not a reason to not try an aromatase inhibitor but it is a frequent reason to stop it (in other words 20% of women will not be able to tolerate these medications).  Tamoxifen on the other hand does not block estrogen production. It does not "take away a woman's estrogen". It blocks the estrogen receptor in breast cells. Like anastrozole, it can also cause hot flashes. As opposed to anastrozole, tamoxifen has many estrogen-like effects. It is technically an estrogen receptor modulator. This means that in some tissues tamoxifen works like estrogen-- for example it helps strengthen the bones. It tends to improve the cholesterol profile. It can cause thickening of the endometrial lining, and even  endometrial polyps or rarely cancer of the uterus.(The risk of uterine cancer due to tamoxifen is one additional cancer per thousand women year). It can cause vaginal wetness or stickiness. It can cause blood clots through this estrogen-like effect--the risk of blood clots with tamoxifen is exactly the same as with birth control pills or hormone replacement.  Neither of these agents causes mood changes or weight gain, despite the popular belief that they can have these side effects. We have data from studies comparing either of these drugs with placebo, and in those cases the control group had the same amount of weight gain and depression as the group that took the drug.  After all this discussion she is pretty clear she would like to give tamoxifen a try. What is driving her here of course is concerned regarding bone density issues  I have made her a return appointment in 3 months. If everything is "perfect", she will cancel that appointment and return to see me in May, after her April 2019 mammography. However if she is interested in using vaginal estrogen she will keep the November appointment  She knows to call for any other issues that may develop before her return visit here.   Chauncey Cruel, MD   05/10/2017 2:54 PM Medical Oncology and Hematology [facility] [address] [place], [place] [address] Tel. [phone]    Fax. [phone]

## 2017-05-11 ENCOUNTER — Other Ambulatory Visit: Payer: Self-pay | Admitting: *Deleted

## 2017-05-18 NOTE — Progress Notes (Signed)
  Radiation Oncology         (336) [phone] ________________________________  Name: Toni Bowers MRN: [id number]  Date: 05/04/2017  DOB: 11/19/1965  End of Treatment Note  Diagnosis:   51 y.o. woman with Malignant Neoplasm of lower-outer quadrant of left breast, estrogen receptor positive ([place]) and Ductal carcinoma in situ (DCIS) of left breast.     Indication for treatment:  Curative        Radiation treatment dates:   03/17/17 - 05/04/17  Site/dose:    Left Breast // 50.4 Gy in 28 Fx Boost// 10 Gy in 5 fx  Beams/energy:   Photon// 6X   //3D  Narrative: The patient tolerated radiation treatment relatively well.  Patients denies pain and reported that it is more soreness. Skin is hyperpigmented. Patient stated that the skin is itching. She is using Cortizone and Radiplex as ordered. Patient stated that she is fatigued in the afternoon.   Plan: The patient has completed radiation treatment. The patient will return to radiation oncology clinic for routine followup in one month. I advised them to call or return sooner if they have any questions or concerns related to their recovery or treatment.  ------------------------------------------------  Jodelle Gross, MD, PhD  This document serves as a record of services personally performed by Kyung Rudd MD. It was created on his behalf by Delton Coombes, a trained medical scribe. The creation of this record is based on the scribe's personal observations and the provider's statements to them. This document has been checked and approved by the attending provider.

## 2017-05-23 IMAGING — MG BREAST SURGICAL SPECIMEN
3 series · 3 of 3 positions shown · non-contrast
Comparison: Previous exam(s).

CLINICAL DATA: Status post excisional biopsy of the right breast.

The patient also had a left lumpectomy and a left excisional biopsy
on the same day. Specimen radiographs from these additional sites
are dictated separately.
EXAM:
SPECIMEN RADIOGRAPH OF THE RIGHT BREAST

[R RIGHT]
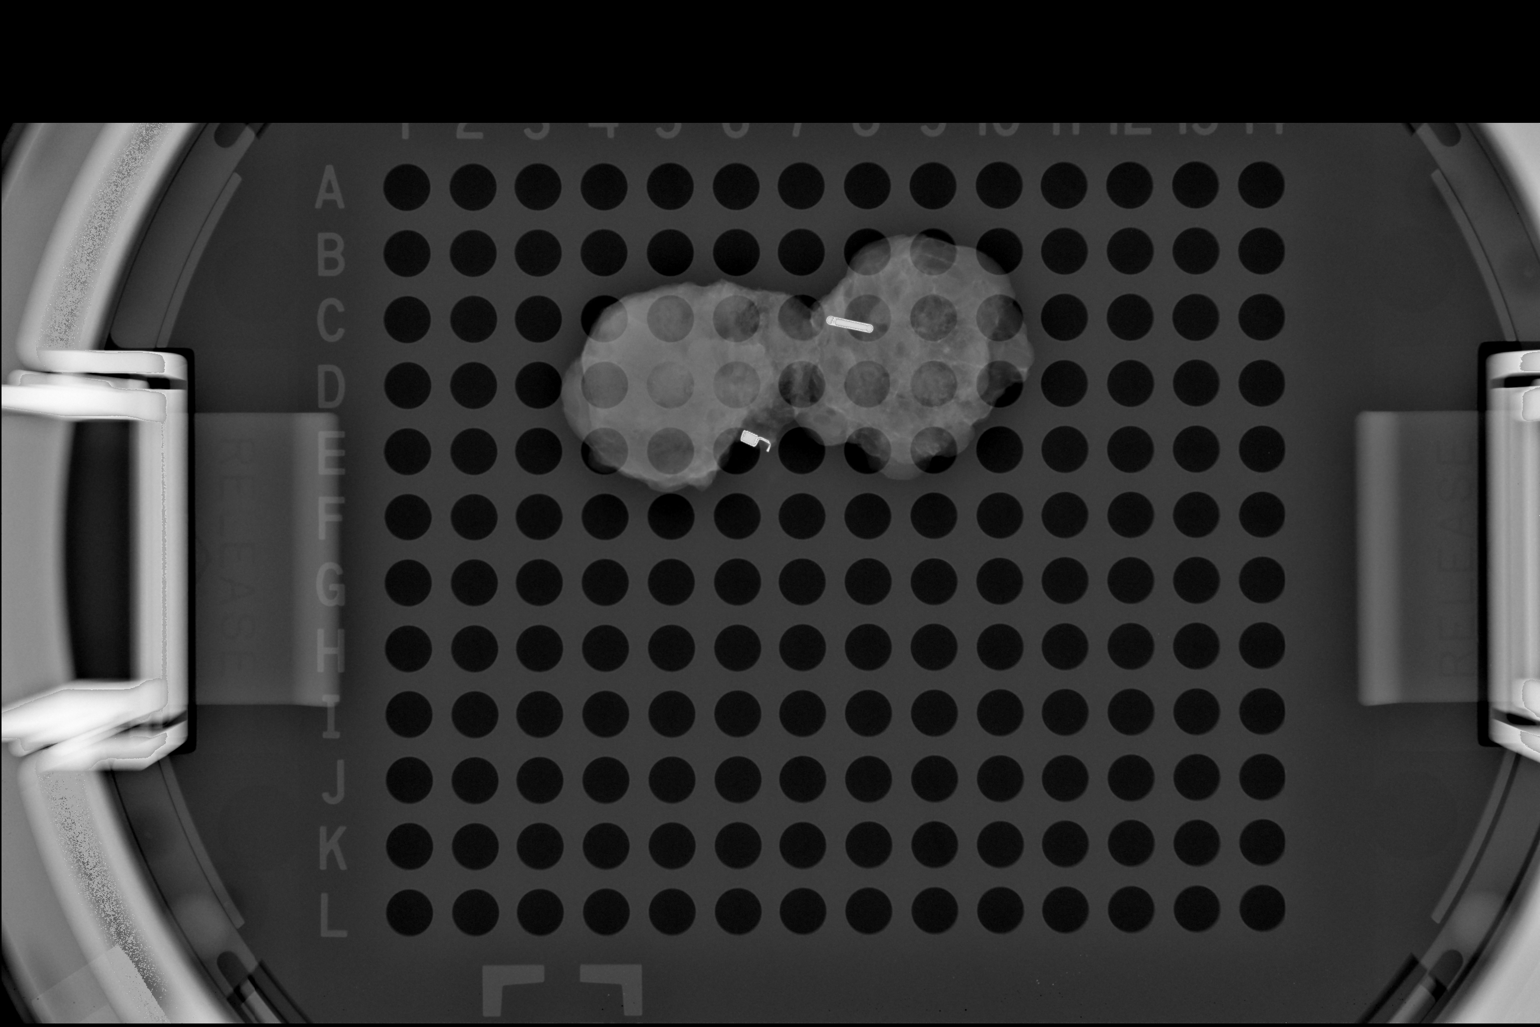

[L LEFT (1 of 2)]
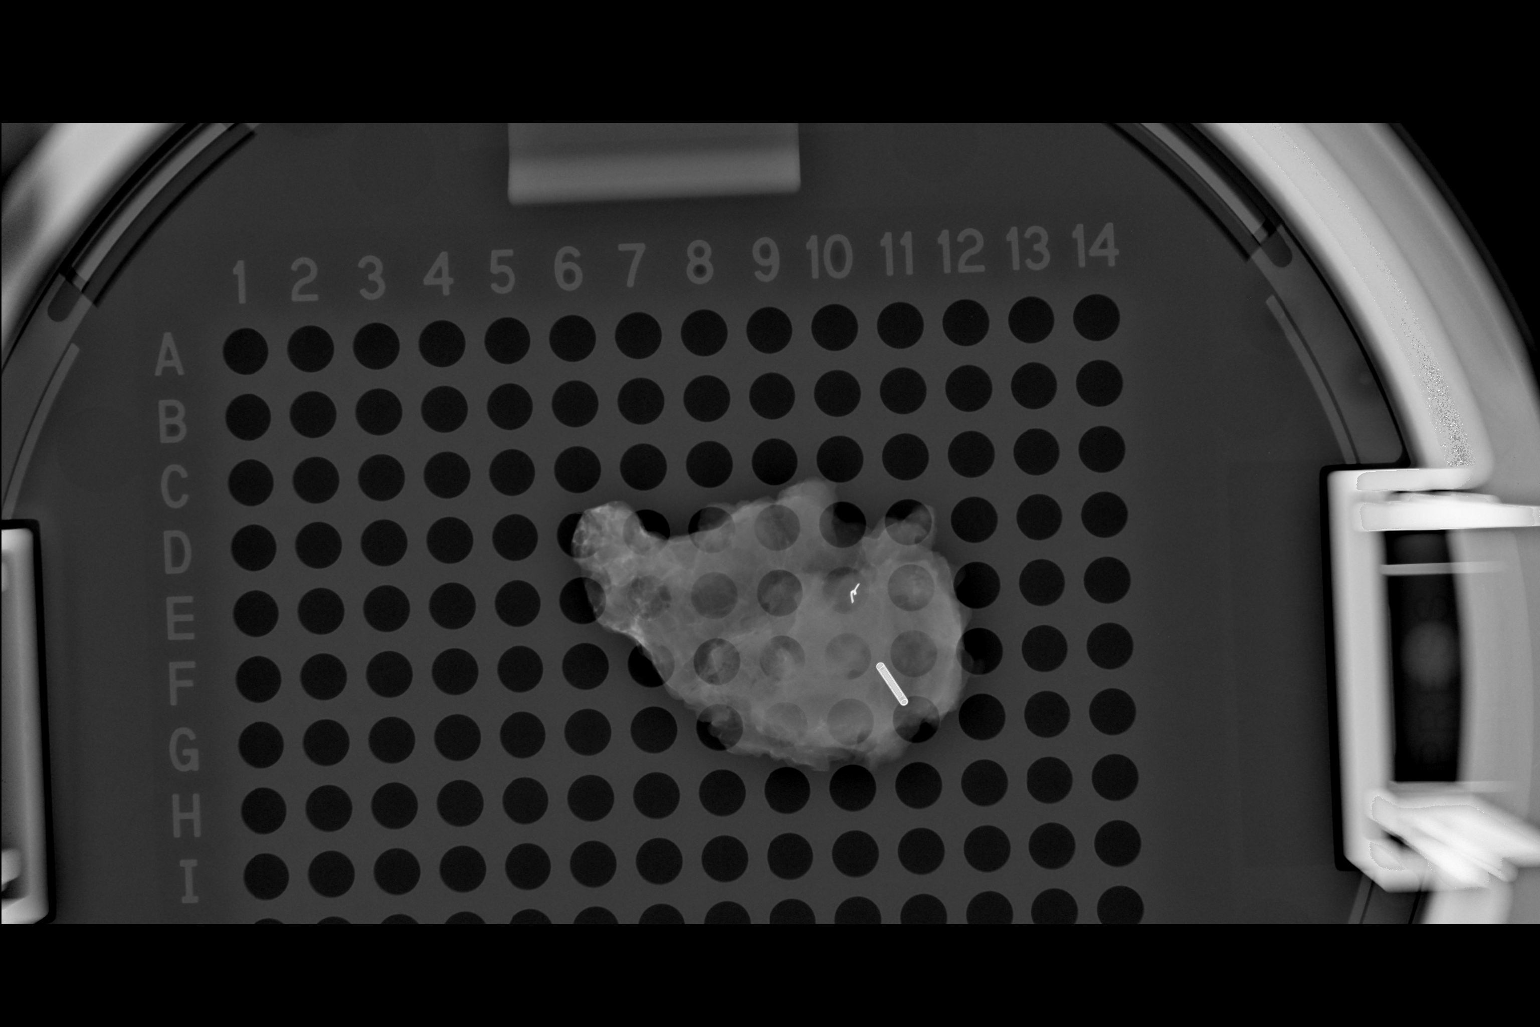

[L LEFT (2 of 2)]
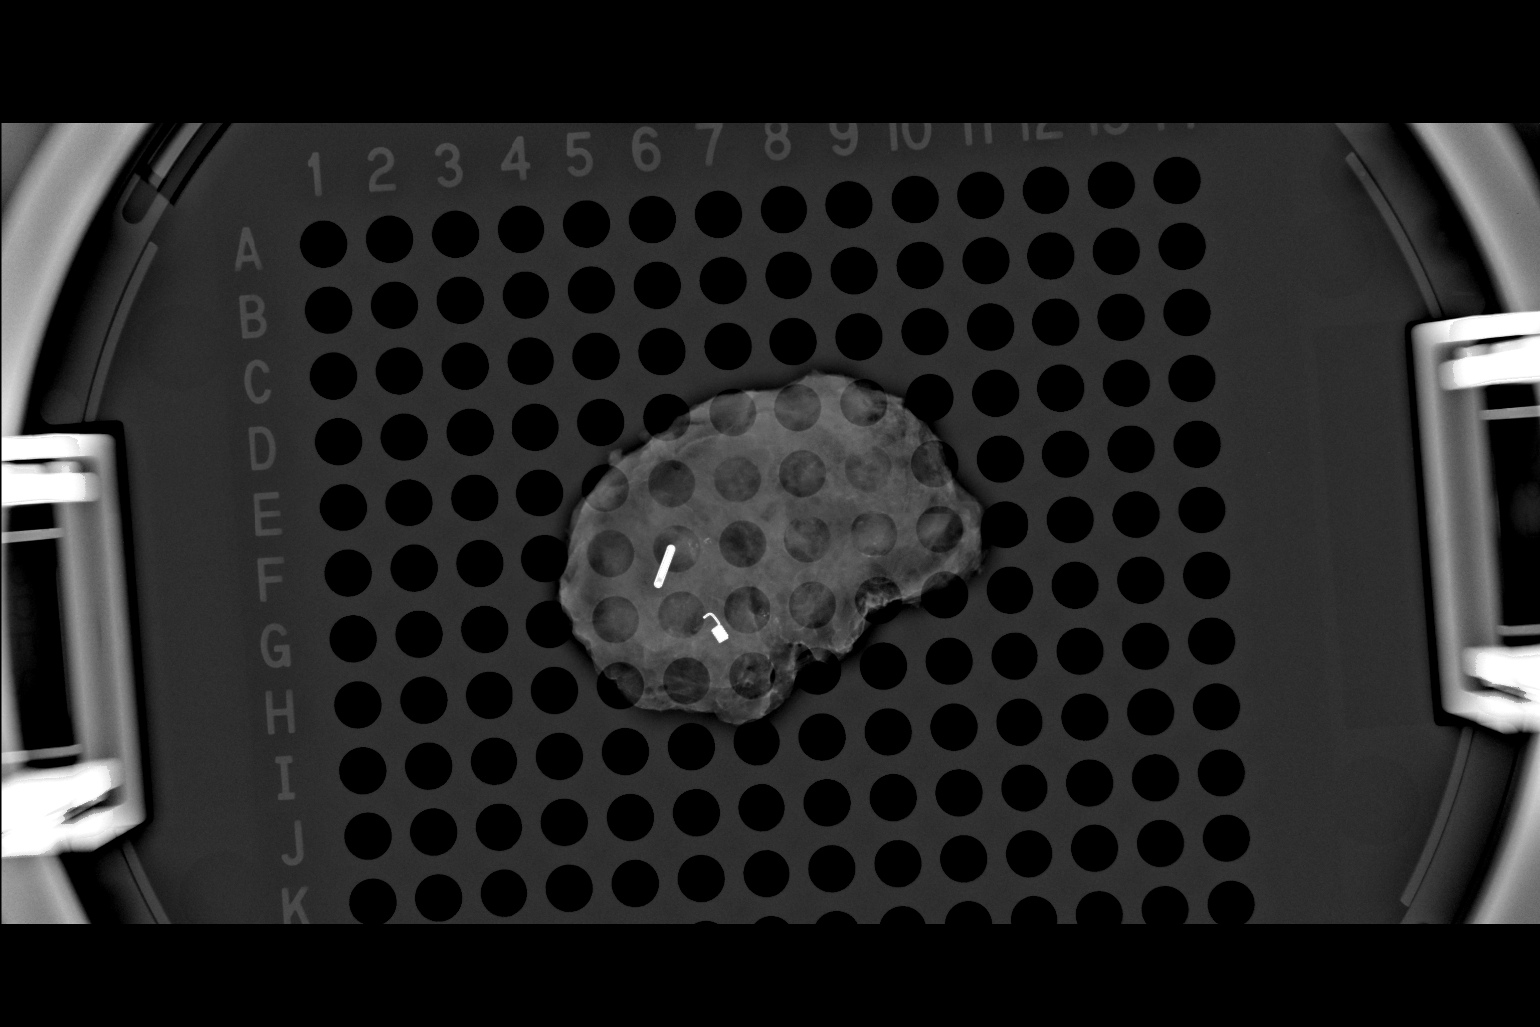

[3 of 3 positions shown; findings below may reference images not displayed]

FINDINGS: Status post excision of the right breast. The radioactive seed and
coil shaped biopsy marker clip are present, completely intact, and
were marked for pathology. Findings were communicated to the
operating room via telephone at [DATE] p.m. on 02/15/2017.
IMPRESSION: Specimen radiograph of the right breast.

## 2017-05-24 ENCOUNTER — Encounter: Payer: Self-pay | Admitting: *Deleted

## 2017-05-24 ENCOUNTER — Other Ambulatory Visit (HOSPITAL_COMMUNITY)
Admission: RE | Admit: 2017-05-24 | Discharge: 2017-05-24 | Disposition: A | Discharge status: Home / Self Care | Payer: 59 | Source: Ambulatory Visit | Attending: Family Medicine | Admitting: Family Medicine

## 2017-05-24 ENCOUNTER — Other Ambulatory Visit: Payer: Self-pay | Admitting: Family Medicine

## 2017-05-24 DIAGNOSIS — Z0000 Encounter for general adult medical examination without abnormal findings: Secondary | ICD-10-CM | POA: Diagnosis not present

## 2017-05-24 DIAGNOSIS — D0512 Intraductal carcinoma in situ of left breast: Secondary | ICD-10-CM | POA: Diagnosis not present

## 2017-05-24 DIAGNOSIS — Z01411 Encounter for gynecological examination (general) (routine) with abnormal findings: Secondary | ICD-10-CM | POA: Diagnosis not present

## 2017-05-24 DIAGNOSIS — Z23 Encounter for immunization: Secondary | ICD-10-CM | POA: Diagnosis not present

## 2017-05-26 LAB — CYTOLOGY - PAP: DIAGNOSIS: NEGATIVE

## 2017-06-01 ENCOUNTER — Telehealth: Payer: Self-pay | Admitting: *Deleted

## 2017-06-01 NOTE — Telephone Encounter (Signed)
Pt called with c/o hotflashes. Relate tried Effexor 37.5mg  and did not like "how it made me feel". Discussed with pt that it would be best to see Ria Comment to discuss other interventions for hotflashes. Scheduled and confirmed on 9/6 at 9am. Denies further needs at this time.

## 2017-06-02 NOTE — Progress Notes (Signed)
CLINIC:  Survivorship   REASON FOR VISIT:  Routine follow-up post-treatment for a recent history of breast cancer.  BRIEF ONCOLOGIC HISTORY:    Malignant neoplasm of lower-outer quadrant of left breast of female, estrogen receptor positive (Toni Bowers)   01/19/2017 Initial Biopsy    Left breast lower outer quadrant biopsy: DCIS, grade 3, ER/PR+ Right breast upper outer quadrant biopsy: complex sclerosing lesion      01/26/2017 Initial Diagnosis    Malignant neoplasm of lower-outer quadrant of left breast of female, estrogen receptor positive (Toni Bowers)     02/15/2017 Surgery    Bilateral lumpectomies Right: Complex sclerosing lesion Left: DCIS, 1.5cm, margins, negative.         03/17/2017 - 05/04/2017 Radiation Therapy    Adjuvant Radiation ([facility]):Left Breast // 50.4 Gy in 28 Fx  Boost// 10 Gy in 5 fx      04/2017 -  Anti-estrogen oral therapy    Tamoxifen daily       INTERVAL HISTORY:  Toni Bowers presents to the [facility] today for our initial meeting to review her survivorship care plan detailing her treatment course for breast cancer, as well as monitoring long-term side effects of that treatment, education regarding health maintenance, screening, and overall wellness and health promotion.     Overall, Toni Bowers is doing ok.  She is experiences hot flashes and these are miserable. These occur once every twenty minutes throughout the day, and throughout the night.  These are more frequent with anxiety.  She has taken Motrin, she has tried Effexor.  Effexor worked, however caused mind racing, difficulty sleeping, and gagging (throat fullness).  She does smoke cigarettes, about 1/2 ppd, and drinks tea throughout the day also.  She is not yet ready to quit smoking.      REVIEW OF SYSTEMS:  Review of Systems  Constitutional: Negative for appetite change, chills, fatigue, fever and unexpected weight change.  HENT:   Negative for hearing loss and lump/mass.   Eyes: Negative  for eye problems and icterus.  Respiratory: Negative for chest tightness, cough and shortness of breath.   Cardiovascular: Negative for chest pain, leg swelling and palpitations.  Gastrointestinal: Negative for abdominal distention, abdominal pain, constipation, diarrhea, nausea and vomiting.  Endocrine: Positive for hot flashes.  Genitourinary: Negative for difficulty urinating.   Musculoskeletal: Negative for arthralgias.  Skin: Negative for itching and rash.  Neurological: Negative for dizziness, extremity weakness, headaches and numbness.  Hematological: Negative for adenopathy. Does not bruise/bleed easily.  Psychiatric/Behavioral: Negative for depression. The patient is not nervous/anxious.    Breast: Denies any new nodularity, masses, tenderness, nipple changes, or nipple discharge.      ONCOLOGY TREATMENT TEAM:  1. Surgeon:  Dr. Donne Hazel at [facility] 2. Medical Oncologist: Dr. Jana Hakim  3. Radiation Oncologist: Dr. Lisbeth Renshaw    PAST MEDICAL/SURGICAL HISTORY:  Past Medical History:  Diagnosis Date  . Anxiety   . Chronic constipation   . Hypertension   . Internal prolapsed hemorrhoids    and bleeding  . Lactose intolerance   . Wears glasses    Past Surgical History:  Procedure Laterality Date  . AUGMENTATION MAMMAPLASTY Bilateral 1999   retro-pectoral saline  . BREAST IMPLANT EXCHANGE  01/  2016  . BREAST LUMPECTOMY WITH RADIOACTIVE SEED LOCALIZATION Left 02/15/2017   Procedure: LEFT BREAST LUMPECTOMY WITH RADIOACTIVE SEED LOCALIZATION X 2;  Surgeon: Rolm Bookbinder, MD;  Location: [place];  Service: General;  Laterality: Left;  . HEMORRHOID SURGERY N/A 10/17/2015  Procedure: HEMORRHOIDECTOMY;  Surgeon: Jackolyn Confer, MD;  Location: [facility];  Service: General;  Laterality: N/A;  . PLACEMENT OF BREAST IMPLANTS Bilateral 1999  . RADIOACTIVE SEED GUIDED EXCISIONAL BREAST BIOPSY Right 02/15/2017   Procedure: RIGHT  BREAST SEED GUIDED EXCISIONAL BREAST BIOPSY;  Surgeon: Rolm Bookbinder, MD;  Location: [place];  Service: General;  Laterality: Right;     ALLERGIES:  Allergies  Allergen Reactions  . Codeine Nausea And Vomiting  . Metoprolol Succinate [Metoprolol] Swelling    Throat swelling  . Zoloft [Sertraline Hcl] Swelling    "throat closes"  . Adhesive [Tape] Itching, Swelling and Rash     CURRENT MEDICATIONS:  Outpatient Encounter Prescriptions as of 06/03/2017  Medication Sig  . azelastine (ASTELIN) 0.1 % nasal spray Place 1 spray into both nostrils 2 (two) times daily. Use in each nostril as directed  . Biotin 5000 MCG CAPS Take 1 capsule by mouth every morning.  . busPIRone (BUSPAR) 10 MG tablet Take 10 mg by mouth daily.   . Calcium Carbonate-Vitamin D (CALCIUM 600+D) 600-400 MG-UNIT tablet Take 1 tablet by mouth daily.  Marland Kitchen ibuprofen (ADVIL,MOTRIN) 200 MG tablet Take 800 mg by mouth every 6 (six) hours as needed.  Marland Kitchen lisinopril (PRINIVIL,ZESTRIL) 5 MG tablet Take 6 mg by mouth daily.   . Multiple Vitamin (MULTIVITAMIN) tablet Take 1 tablet by mouth daily.  . Omega-3 Fatty Acids (FISH OIL) 1200 MG CAPS Take 1 capsule by mouth daily.  . polycarbophil (FIBERCON) 625 MG tablet Take 625 mg by mouth 2 (two) times daily.  . tamoxifen (NOLVADEX) 20 MG tablet Take 1 tablet (20 mg total) by mouth daily.  . vitamin B-12 (CYANOCOBALAMIN) 1000 MCG tablet Take 1,000 mcg by mouth daily.  Marland Kitchen gabapentin (NEURONTIN) 100 MG capsule Take 1-3 capsules (100-300 mg total) by mouth at bedtime. Taper up by 100mg  to 300mg  QHS   No facility-administered encounter medications on file as of 06/03/2017.      ONCOLOGIC FAMILY HISTORY:  Family History  Problem Relation Age of Onset  . Breast cancer Mother       SOCIAL HISTORY:  unchanged   PHYSICAL EXAMINATION:  Vital Signs:   Vitals:   06/03/17 0922  BP: (!) 111/38  Pulse: (!) 55  Resp: 17  Temp: 97.8 F (36.6 C)  SpO2: 100%    Filed Weights   06/03/17 0922  Weight: 104 lb 14.4 oz (47.6 kg)   General: Well-nourished, well-appearing female in no acute distress.  She is unaccompanied today.   HEENT: Head is normocephalic.  Pupils equal and reactive to light. Conjunctivae clear without exudate.  Sclerae anicteric. Oral mucosa is pink, moist.  Oropharynx is pink without lesions or erythema.  Lymph: No cervical, supraclavicular, or infraclavicular lymphadenopathy noted on palpation.  Cardiovascular: Regular rate and rhythm.Marland Kitchen Respiratory: Clear to auscultation bilaterally. Chest expansion symmetric; breathing non-labored.  Breasts: s/p bilateral lumpectomies, sites are healing well with very mild scar tissue.  Implants in place bilaterally.  No nodules, masses, skin changes otherwise noted.  GI: Abdomen soft and round; non-tender, non-distended. Bowel sounds normoactive.  GU: Deferred.  Neuro: No focal deficits. Steady gait.  Psych: Mood and affect normal and appropriate for situation.  Extremities: No edema. MSK: No focal spinal tenderness to palpation.  Full range of motion in bilateral upper extremities Skin: Warm and dry.  LABORATORY DATA:  None for this visit.  DIAGNOSTIC IMAGING:  None for this visit.      ASSESSMENT AND PLAN:  Toni Bowers is a pleasant 51 y.o. female with Stage 0 left breast DCIS, ER+, diagnosed in 01/2017, treated with lumpectomy, adjuvant radiation therapy, and anti-estrogen therapy with Tamoxifen beginning in August, 2018.  She presents to the Survivorship Clinic for our initial meeting and routine follow-up post-completion of treatment for breast cancer.    1. Stage 0 left breast cancer:  Toni Bowers is continuing to recover from definitive treatment for breast cancer. She will follow-up with her medical oncologist, Dr. Jana Hakim in May, 2019 with history and physical exam per surveillance protocol.  She will continue her anti-estrogen therapy with Tamoxifen.  She is experiencing hot  flashes (see #2).  Today, a comprehensive survivorship care plan and treatment summary was reviewed with the patient today detailing her breast cancer diagnosis, treatment course, potential late/long-term effects of treatment, appropriate follow-up care with recommendations for the future, and patient education resources.  A copy of this summary, along with a letter will be sent to the patient's primary care provider via mail/fax/In Basket message after today's visit.    2. Hot flashes:  Toni Bowers and I reviewed this in detail.  Her hot flashes, are coming from the Tamoxifen, however her smoking cigarettes and drinking tea throughout the day can certainly worsen these.  I did recommend she cut back on those habits.  In addition, we discussed Gabapentin in detail.  I reviewed Gabapentin with her and gave her detailed information about this in her AVS.  She is going to start on Gabapentin at 100mg  per night, and slowly increase to 300mg  at night.  She will try this and see how it goes.  If it works well, we may be able to add a day time dose if needed.    3. Bone health:  Given Toni Bowers's age/history of breast cancer, she is at risk for bone demineralization.  She was given education on specific activities to promote bone health.  I will defer to her PCP regarding further bone density testing and management.  I did counsel her that Tamoxifen has a protective effect on her bones.    4. Cancer screening:  Due to Toni Bowers's history and her age, she should receive screening for skin cancers, colon cancer, and gynecologic cancers.  The information and recommendations are listed on the patient's comprehensive care plan/treatment summary and were reviewed in detail with the patient.  Though she is not yet ready for this, I did briefly review with her current smoking habit, she may be eligible for lung cancer screening in a few years.  She knows to discuss this further with her PCP.    5. Health maintenance and  wellness promotion: Toni Bowers was encouraged to consume 5-7 servings of fruits and vegetables per day. We reviewed the "Nutrition Rainbow" handout, as well as the handout "Take Control of Your Health and Reduce Your Cancer Risk" from the [employer].  She was also encouraged to engage in moderate to vigorous exercise for 30 minutes per day most days of the week. We discussed the [employer] fitness program, which is designed for cancer survivors to help them become more physically fit after cancer treatments.  She was instructed to limit her alcohol consumption and continue to abstain from tobacco use.     6. Support services/counseling: It is not uncommon for this period of the patient's cancer care trajectory to be one of many emotions and stressors.  We discussed an opportunity for her to participate in the next session of [facility] ("  Finding Your New Normal") support group series designed for patients after they have completed treatment.   Toni Bowers was encouraged to take advantage of our many other support services programs, support groups, and/or counseling in coping with her new life as a cancer survivor after completing anti-cancer treatment.  She was offered support today through active listening and expressive supportive counseling.  She was given information regarding our available services and encouraged to contact me with any questions or for help enrolling in any of our support group/programs.    Dispo:   -Return to cancer center in May, 2019 for follow up with Dr. Jana Hakim  -Mammogram due in 12/2017 -Follow up with Dr. Donne Hazel at [facility] in 08/2017 -She is welcome to return back to the Survivorship Clinic at any time; no additional follow-up needed at this time.  -Consider referral back to survivorship as a long-term survivor for continued surveillance  A total of (30) minutes of face-to-face time was spent with this patient with greater than 50% of that  time in counseling and care-coordination.   Gardenia Phlegm, NP Survivorship Program [place] [phone]   Note: PRIMARY CARE PROVIDER Darcus Austin, [place] [phone]

## 2017-06-03 ENCOUNTER — Ambulatory Visit (HOSPITAL_BASED_OUTPATIENT_CLINIC_OR_DEPARTMENT_OTHER): Payer: 59 | Admitting: Adult Health

## 2017-06-03 ENCOUNTER — Encounter: Payer: Self-pay | Admitting: Adult Health

## 2017-06-03 VITALS — BP 111/38 | HR 55 | Temp 97.8°F | Resp 17 | Ht 63.0 in | Wt 104.9 lb

## 2017-06-03 DIAGNOSIS — D0512 Intraductal carcinoma in situ of left breast: Secondary | ICD-10-CM | POA: Diagnosis not present

## 2017-06-03 DIAGNOSIS — Z720 Tobacco use: Secondary | ICD-10-CM

## 2017-06-03 DIAGNOSIS — Z170 Estrogen receptor positive status [ER+]: Secondary | ICD-10-CM

## 2017-06-03 DIAGNOSIS — N951 Menopausal and female climacteric states: Secondary | ICD-10-CM | POA: Diagnosis not present

## 2017-06-03 DIAGNOSIS — C50512 Malignant neoplasm of lower-outer quadrant of left female breast: Secondary | ICD-10-CM

## 2017-06-03 DIAGNOSIS — R232 Flushing: Secondary | ICD-10-CM

## 2017-06-03 MED ORDER — GABAPENTIN 100 MG PO CAPS: 100.0000 mg | ORAL_CAPSULE | Freq: Every day | ORAL | 0 refills | Status: DC

## 2017-06-03 NOTE — Patient Instructions (Signed)
Gabapentin capsules or tablets What is this medicine? GABAPENTIN (GA ba pen tin) is used to control partial seizures in adults with epilepsy. It is also used to treat certain types of nerve pain. This medicine may be used for other purposes; ask your health care provider or pharmacist if you have questions. COMMON BRAND NAME(S): Active-PAC with Gabapentin, Gabarone, Neurontin What should I tell my health care provider before I take this medicine? They need to know if you have any of these conditions: -kidney disease -suicidal thoughts, plans, or attempt; a previous suicide attempt by you or a family member -an unusual or allergic reaction to gabapentin, other medicines, foods, dyes, or preservatives -pregnant or trying to get pregnant -breast-feeding How should I use this medicine? Take this medicine by mouth with a glass of water. Follow the directions on the prescription label. You can take it with or without food. If it upsets your stomach, take it with food.Take your medicine at regular intervals. Do not take it more often than directed. Do not stop taking except on your doctor's advice. If you are directed to break the 600 or 800 mg tablets in half as part of your dose, the extra half tablet should be used for the next dose. If you have not used the extra half tablet within 28 days, it should be thrown away. A special MedGuide will be given to you by the pharmacist with each prescription and refill. Be sure to read this information carefully each time. Talk to your pediatrician regarding the use of this medicine in children. Special care may be needed. Overdosage: If you think you have taken too much of this medicine contact a poison control center or emergency room at once. NOTE: This medicine is only for you. Do not share this medicine with others. What if I miss a dose? If you miss a dose, take it as soon as you can. If it is almost time for your next dose, take only that dose. Do not  take double or extra doses. What may interact with this medicine? Do not take this medicine with any of the following medications: -other gabapentin products This medicine may also interact with the following medications: -alcohol -antacids -antihistamines for allergy, cough and cold -certain medicines for anxiety or sleep -certain medicines for depression or psychotic disturbances -homatropine; hydrocodone -naproxen -narcotic medicines (opiates) for pain -phenothiazines like chlorpromazine, mesoridazine, prochlorperazine, thioridazine This list may not describe all possible interactions. Give your health care provider a list of all the medicines, herbs, non-prescription drugs, or dietary supplements you use. Also tell them if you smoke, drink alcohol, or use illegal drugs. Some items may interact with your medicine. What should I watch for while using this medicine? Visit your doctor or health care professional for regular checks on your progress. You may want to keep a record at home of how you feel your condition is responding to treatment. You may want to share this information with your doctor or health care professional at each visit. You should contact your doctor or health care professional if your seizures get worse or if you have any new types of seizures. Do not stop taking this medicine or any of your seizure medicines unless instructed by your doctor or health care professional. Stopping your medicine suddenly can increase your seizures or their severity. Wear a medical identification bracelet or chain if you are taking this medicine for seizures, and carry a card that lists all your medications. You may get drowsy, dizzy,   or have blurred vision. Do not drive, use machinery, or do anything that needs mental alertness until you know how this medicine affects you. To reduce dizzy or fainting spells, do not sit or stand up quickly, especially if you are an older patient. Alcohol can  increase drowsiness and dizziness. Avoid alcoholic drinks. Your mouth may get dry. Chewing sugarless gum or sucking hard candy, and drinking plenty of water will help. The use of this medicine may increase the chance of suicidal thoughts or actions. Pay special attention to how you are responding while on this medicine. Any worsening of mood, or thoughts of suicide or dying should be reported to your health care professional right away. Women who become pregnant while using this medicine may enroll in the [place] Antiepileptic Drug Pregnancy Registry by calling 1-888-233-2334. This registry collects information about the safety of antiepileptic drug use during pregnancy. What side effects may I notice from receiving this medicine? Side effects that you should report to your doctor or health care professional as soon as possible: -allergic reactions like skin rash, itching or hives, swelling of the face, lips, or tongue -worsening of mood, thoughts or actions of suicide or dying Side effects that usually do not require medical attention (report to your doctor or health care professional if they continue or are bothersome): -constipation -difficulty walking or controlling muscle movements -dizziness -nausea -slurred speech -tiredness -tremors -weight gain This list may not describe all possible side effects. Call your doctor for medical advice about side effects. You may report side effects to FDA at 1-800-FDA-1088. Where should I keep my medicine? Keep out of reach of children. This medicine may cause accidental overdose and death if it taken by other adults, children, or pets. Mix any unused medicine with a substance like cat litter or coffee grounds. Then throw the medicine away in a sealed container like a sealed bag or a coffee can with a lid. Do not use the medicine after the expiration date. Store at room temperature between 15 and 30 degrees C (59 and 86 degrees F). NOTE: This  sheet is a summary. It may not cover all possible information. If you have questions about this medicine, talk to your doctor, pharmacist, or health care provider.  2018 Elsevier/Gold Standard (2013-11-10 15:26:50)  

## 2017-07-15 ENCOUNTER — Other Ambulatory Visit: Payer: Self-pay | Admitting: Adult Health

## 2017-07-15 DIAGNOSIS — R232 Flushing: Secondary | ICD-10-CM

## 2017-07-26 ENCOUNTER — Telehealth: Payer: Self-pay | Admitting: Oncology

## 2017-07-26 NOTE — Telephone Encounter (Signed)
Faxed completed Treatment questionnaire on 07/26/2017 to [facility] [phone].  Called patient and left message that forms were completed and being faxed to [phone] @ 4:19 pm

## 2017-08-04 ENCOUNTER — Ambulatory Visit: Payer: 59 | Admitting: Oncology

## 2017-08-13 ENCOUNTER — Encounter: Payer: 59 | Admitting: Adult Health

## 2017-08-21 ENCOUNTER — Other Ambulatory Visit: Payer: Self-pay | Admitting: Adult Health

## 2017-08-21 DIAGNOSIS — R232 Flushing: Secondary | ICD-10-CM

## 2017-09-17 ENCOUNTER — Other Ambulatory Visit: Payer: Self-pay | Admitting: Adult Health

## 2017-09-17 DIAGNOSIS — R232 Flushing: Secondary | ICD-10-CM

## 2017-10-08 ENCOUNTER — Other Ambulatory Visit: Payer: Self-pay | Admitting: Adult Health

## 2017-10-08 DIAGNOSIS — R232 Flushing: Secondary | ICD-10-CM

## 2017-10-11 DIAGNOSIS — D0512 Intraductal carcinoma in situ of left breast: Secondary | ICD-10-CM | POA: Diagnosis not present

## 2017-11-05 ENCOUNTER — Other Ambulatory Visit: Payer: Self-pay | Admitting: Adult Health

## 2017-11-05 DIAGNOSIS — R232 Flushing: Secondary | ICD-10-CM

## 2017-12-05 ENCOUNTER — Other Ambulatory Visit: Payer: Self-pay | Admitting: Adult Health

## 2017-12-05 DIAGNOSIS — R232 Flushing: Secondary | ICD-10-CM

## 2017-12-23 DIAGNOSIS — D2272 Melanocytic nevi of left lower limb, including hip: Secondary | ICD-10-CM | POA: Diagnosis not present

## 2017-12-23 DIAGNOSIS — D225 Melanocytic nevi of trunk: Secondary | ICD-10-CM | POA: Diagnosis not present

## 2017-12-23 DIAGNOSIS — Z86018 Personal history of other benign neoplasm: Secondary | ICD-10-CM | POA: Diagnosis not present

## 2018-01-02 ENCOUNTER — Other Ambulatory Visit: Payer: Self-pay | Admitting: Adult Health

## 2018-01-02 DIAGNOSIS — R232 Flushing: Secondary | ICD-10-CM

## 2018-01-31 NOTE — Progress Notes (Signed)
[place]  Telephone:([phone] Fax:(336) [phone]     ID: Alawna Graybeal DOB: 09/04/1966  MR#: [id number]  MEQ#:[id number]  Patient Care Team: Darcus Austin, MD as PCP - General (Family Medicine) Rolm Bookbinder, MD as Consulting Physician (General Surgery) Shada Nienaber, Virgie Dad, MD as Consulting Physician (Oncology) Kyung Rudd, MD as Consulting Physician (Radiation Oncology) Haverstock, Jennefer Bravo, MD as Referring Physician (Dermatology) Delice Bison, Charlestine Massed, NP as Nurse Practitioner (Hematology and Oncology) OTHER MD:  CHIEF COMPLAINT: Ductal carcinoma in situ  CURRENT TREATMENT: tamoxifen   BREAST CANCER HISTORY: From the original intake note:  Kelani had routine bilateral screening mammography with tomography at the [facility] 01/05/2017. The breast density was category D. The patient has retropectoral implants in place. There was a possible mass or area of distortion in the right breast and on 01/12/2017 the patient underwent right diagnostic mammography with tomography and ultrasonography. This time the breast density was read as category C.   In the upper outer right breast there was an area of persistent distortion. This was not palpable. This area was sonographically silent and there was no right axillary adenopathy.  In the left breast however there were 2 separate groups of calcifications 1 measuring 0.4 cm in the upper outer quadrant and one measuring 1.0 cm in the lateral left breast.  On 01/19/2017 the patient underwent biopsy of the suspicious area in the upper-outer quadrant of the right breast and this showed ([id number]) a complex sclerosing lesion. On the same day in the left breast the patient had biopsies of the 2 areas of calcification of concern. The one in the upper-outer quadrant was benign. The one in the lower outer quadrant showed ductal carcinoma in situ, high-grade, estrogen receptor positive at 95%, and progesterone receptor  positive at 70%, both with strong staining intensity.  Her subsequent history is as detailed below   INTERVAL HISTORY: Taylin returns today for follow-up and treatment of her ductal carcinoma in situ. She continues on tamoxifen, with fair tolerance. She is still having hot flashes. She takes gabapentin at night. She cut back to taking one due to making her feel dizzy. She does not wake up at night with sweats. She stopped taking venlafaxine due it throat discomfort. She denies issues with increase vaginal discharge. She is also feeling a tingling sensation on the left side of her trunk.   REVIEW OF SYSTEMS: Elita reports that she came back from the [place]. She started working out more often, but she is experiencing weight gain. She has not had a period in the last year. She denies unusual headaches, visual changes, nausea, vomiting, or dizziness. There has been no unusual cough, phlegm production, or pleurisy. This been no change in bowel or bladder habits. She denies unexplained fatigue or unexplained weight loss, bleeding, rash, or fever. A detailed review of systems was otherwise stable.   PAST MEDICAL HISTORY: Past Medical History:  Diagnosis Date  . Anxiety   . Chronic constipation   . Hypertension   . Internal prolapsed hemorrhoids    and bleeding  . Lactose intolerance   . Wears glasses     PAST SURGICAL HISTORY: Past Surgical History:  Procedure Laterality Date  . AUGMENTATION MAMMAPLASTY Bilateral 1999   retro-pectoral saline  . BREAST IMPLANT EXCHANGE  01/  2016  . BREAST LUMPECTOMY WITH RADIOACTIVE SEED LOCALIZATION Left 02/15/2017   Procedure: LEFT BREAST LUMPECTOMY WITH RADIOACTIVE SEED LOCALIZATION X 2;  Surgeon: Rolm Bookbinder, MD;  Location: [facility];  Service: General;  Laterality: Left;  . HEMORRHOID SURGERY N/A 10/17/2015   Procedure: HEMORRHOIDECTOMY;  Surgeon: Jackolyn Confer, MD;  Location: [facility];  Service:  General;  Laterality: N/A;  . PLACEMENT OF BREAST IMPLANTS Bilateral 1999  . RADIOACTIVE SEED GUIDED EXCISIONAL BREAST BIOPSY Right 02/15/2017   Procedure: RIGHT BREAST SEED GUIDED EXCISIONAL BREAST BIOPSY;  Surgeon: Rolm Bookbinder, MD;  Location: [place];  Service: General;  Laterality: Right;    FAMILY HISTORY Family History  Problem Relation Age of Onset  . Breast cancer Mother   The patient's mother, [name], died from breast cancer, recurrent, in 2013 at age 41. The patient's father is still living at age 28 as of May 2018. The patient has one brother, no sisters. There is no other history of breast or ovarian cancer in the family.  GYNECOLOGIC HISTORY:  No LMP recorded. Menarche age 52, the patient is GX P0. She stopped having periods October 2017 but then they resumed March 2018 and came right on time in April 2018.  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 Her husband Marcello Moores runs the [employer] out of [place]. At home is just the 2 of them plus 2 chihuahuas    ADVANCED DIRECTIVES: In place   HEALTH MAINTENANCE: Social History   Tobacco Use  . Smoking status: Current Every Day Smoker    Packs/day: 1.00    Years: 30.00    Pack years: 30.00    Types: Cigarettes  . Smokeless tobacco: Never Used  Substance Use Topics  . Alcohol use: No  . Drug use: No     Colonoscopy: October 2016  PAP:  Bone density: 2004?   Allergies  Allergen Reactions  . Codeine Nausea And Vomiting  . Metoprolol Succinate [Metoprolol] Swelling    Throat swelling  . Zoloft [Sertraline Hcl] Swelling    "throat closes"  . Adhesive [Tape] Itching, Swelling and Rash    Current Outpatient Medications  Medication Sig Dispense Refill  . azelastine (ASTELIN) 0.1 % nasal spray Place 1 spray into both nostrils 2 (two) times daily. Use in each nostril as directed    . Biotin 5000 MCG CAPS Take 1 capsule by mouth every  morning.    . busPIRone (BUSPAR) 10 MG tablet Take 10 mg by mouth daily.     . Calcium Carbonate-Vitamin D (CALCIUM 600+D) 600-400 MG-UNIT tablet Take 1 tablet by mouth daily.    Marland Kitchen gabapentin (NEURONTIN) 100 MG capsule TAKE ONE TO THREE CAPSULES BY MOUTH AT BEDTIME, TAPER UP BY ON CAPSULE TO THREE EVERY NIGHT AT BEDTIME 90 capsule 0  . ibuprofen (ADVIL,MOTRIN) 200 MG tablet Take 800 mg by mouth every 6 (six) hours as needed.    Marland Kitchen lisinopril (PRINIVIL,ZESTRIL) 5 MG tablet Take 6 mg by mouth daily.     . Multiple Vitamin (MULTIVITAMIN) tablet Take 1 tablet by mouth daily.    . Omega-3 Fatty Acids (FISH OIL) 1200 MG CAPS Take 1 capsule by mouth daily.    . polycarbophil (FIBERCON) 625 MG tablet Take 625 mg by mouth 2 (two) times daily.    . vitamin B-12 (CYANOCOBALAMIN) 1000 MCG tablet Take 1,000 mcg by mouth daily.     No current facility-administered medications for this visit.     OBJECTIVE: Middle-aged white woman in no acute distress  Vitals:   02/01/18 1154  BP: (!) 117/55  Pulse: (!) 58  Resp: 18  Temp: 99.1 F (37.3 C)  SpO2: 100%     Body mass index is 19.5 kg/m.    ECOG FS:0 - Asymptomatic  Sclerae unicteric, EOMs intact Oropharynx clear and moist No cervical or supraclavicular adenopathy Lungs no rales or rhonchi Heart regular rate and rhythm Abd soft, nontender, positive bowel sounds MSK no focal spinal tenderness, no upper extremity lymphedema Neuro: nonfocal, well oriented, appropriate affect Breasts: Status post bilateral lumpectomies, and left-sided radiation.  I do not palpate any suspicious masses.  Both axillae are benign.  LAB RESULTS:  CMP     Component Value Date/Time   NA 143 01/27/2017 0852   K 4.1 01/27/2017 0852   CL 102 10/14/2015 1258   CO2 30 (H) 01/27/2017 0852   GLUCOSE 86 01/27/2017 0852   BUN 9.1 01/27/2017 0852   CREATININE 0.8 01/27/2017 0852   CALCIUM 10.8 (H) 01/27/2017 0852   PROT 7.4 01/27/2017 0852   ALBUMIN 4.4 01/27/2017 0852     AST 24 01/27/2017 0852   ALT 21 01/27/2017 0852   ALKPHOS 70 01/27/2017 0852   BILITOT 0.51 01/27/2017 0852   GFRNONAA >60 10/14/2015 1258   GFRAA >60 10/14/2015 1258    No results found for: TOTALPROTELP, ALBUMINELP, A1GS, A2GS, BETS, BETA2SER, GAMS, MSPIKE, SPEI  No results found for: Nils Pyle, [facility]  Lab Results  Component Value Date   WBC 6.9 01/27/2017   NEUTROABS 3.9 01/27/2017   HGB 14.7 01/27/2017   HCT 41.9 01/27/2017   MCV 96.8 01/27/2017   PLT 233 01/27/2017      Chemistry      Component Value Date/Time   NA 143 01/27/2017 0852   K 4.1 01/27/2017 0852   CL 102 10/14/2015 1258   CO2 30 (H) 01/27/2017 0852   BUN 9.1 01/27/2017 0852   CREATININE 0.8 01/27/2017 0852      Component Value Date/Time   CALCIUM 10.8 (H) 01/27/2017 0852   ALKPHOS 70 01/27/2017 0852   AST 24 01/27/2017 0852   ALT 21 01/27/2017 0852   BILITOT 0.51 01/27/2017 0852       No results found for: LABCA2  No components found for: [id number]  No results for input(s): INR in the last 168 hours.  Urinalysis No results found for: COLORURINE, APPEARANCEUR, LABSPEC, PHURINE, GLUCOSEU, HGBUR, BILIRUBINUR, KETONESUR, PROTEINUR, UROBILINOGEN, NITRITE, LEUKOCYTESUR   STUDIES: Bilateral diagnostic mammography is scheduled for 05/05/2018  ELIGIBLE FOR AVAILABLE RESEARCH PROTOCOL: no  ASSESSMENT: 52 y.o. [place], [place] woman status post left breast lower outer quadrant biopsy 01/19/2017 for ductal carcinoma in situ, grade 3, estrogen and progesterone receptor positive  (1) right breast upper outer quadrant biopsy 01/19/2017 shows a complex sclerosing lesion  (2) Bilateral lumpectomies 02/15/2017 showed  (a) on the right, only a complex sclerosing lesion  (b) in the left lower outer quadrant ductal carcinoma in situ measuring 1.5 cm, with negative margins  (3) adjuvant radiation completed 05/04/2017  (4) tamoxifen started 05/10/2017  PLAN: Jalia is  now a year out from definitive surgery for her breast cancer with no evidence of disease recurrence.  This is very favorable.  She is tolerating tamoxifen generally well.  The big problem for her is hot flashes.  She is already on a low-dose of gabapentin at bedtime.  That was all she was able to tolerate.  We started her on venlafaxine, low dose, originally, but she developed some throat problems and stopped.  I think it is worth giving that a try again.  She still has  the medication at home.  I checked to see if there was any significant interaction with buspirone and at the dose of venlafaxine we are using that should not be a problem.  If she finds that that helps we will be glad to put in another prescription for her  We discussed 2 other issues also at length.  One was her strange feelings in her left arm and axilla.  Recall she did not have an axillary dissection.  She thinks this may be related to her cervical arthritis.  I offered to repeat a plain film of her neck today to come compare with last years, but when she would like to do is discuss this with Dr. Inda Merlin and then get a referral to see neurosurgery.  I think that is reasonable.  They can obtain an MRI for better evaluation  The other issue we discussed is weight gain.  This is going to be due to menopause and not to tamoxifen.  If she really does not want to gain weight she is going to have to change her diet, eat fewer calories, and most likely cut back significantly on carbohydrates.  Aside from these issues she will have her next mammogram in August and return to see me in 1 year  She knows to call for any other forms that may develop before then.   Kayvon Mo, Virgie Dad, MD  02/01/18 12:21 PM Medical Oncology and Hematology [facility] [address] [place], [place] [address] Tel. [phone]    Fax. [phone]  This document serves as a record of services personally performed by Lurline Del, MD. It  was created on his behalf by Sheron Nightingale, a trained medical scribe. The creation of this record is based on the scribe's personal observations and the provider's statements to them.   I have reviewed the above documentation for accuracy and completeness, and I agree with the above.

## 2018-02-01 ENCOUNTER — Telehealth: Payer: Self-pay | Admitting: Oncology

## 2018-02-01 ENCOUNTER — Inpatient Hospital Stay: Payer: 59 | Attending: Oncology | Admitting: Oncology

## 2018-02-01 VITALS — BP 117/55 | HR 58 | Temp 99.1°F | Resp 18 | Ht 63.0 in | Wt 110.1 lb

## 2018-02-01 DIAGNOSIS — R635 Abnormal weight gain: Secondary | ICD-10-CM | POA: Diagnosis not present

## 2018-02-01 DIAGNOSIS — D0512 Intraductal carcinoma in situ of left breast: Secondary | ICD-10-CM | POA: Diagnosis not present

## 2018-02-01 DIAGNOSIS — R29898 Other symptoms and signs involving the musculoskeletal system: Secondary | ICD-10-CM | POA: Diagnosis not present

## 2018-02-01 DIAGNOSIS — N951 Menopausal and female climacteric states: Secondary | ICD-10-CM | POA: Diagnosis not present

## 2018-02-01 DIAGNOSIS — Z720 Tobacco use: Secondary | ICD-10-CM | POA: Insufficient documentation

## 2018-02-01 DIAGNOSIS — C50512 Malignant neoplasm of lower-outer quadrant of left female breast: Secondary | ICD-10-CM

## 2018-02-01 DIAGNOSIS — R232 Flushing: Secondary | ICD-10-CM

## 2018-02-01 DIAGNOSIS — Z170 Estrogen receptor positive status [ER+]: Secondary | ICD-10-CM | POA: Diagnosis not present

## 2018-02-01 MED ORDER — GABAPENTIN 100 MG PO CAPS: 100.0000 mg | ORAL_CAPSULE | Freq: Every day | ORAL | 4 refills | Status: DC

## 2018-02-01 NOTE — Telephone Encounter (Signed)
Gave patient AVs and calendar of upcoming may 2020 appointments °

## 2018-05-05 ENCOUNTER — Ambulatory Visit
Admission: RE | Admit: 2018-05-05 | Discharge: 2018-05-05 | Disposition: A | Discharge status: Home / Self Care | Payer: 59 | Source: Ambulatory Visit | Attending: Adult Health | Admitting: Adult Health

## 2018-05-05 DIAGNOSIS — C50512 Malignant neoplasm of lower-outer quadrant of left female breast: Secondary | ICD-10-CM

## 2018-05-05 DIAGNOSIS — Z170 Estrogen receptor positive status [ER+]: Principal | ICD-10-CM

## 2018-05-05 DIAGNOSIS — R922 Inconclusive mammogram: Secondary | ICD-10-CM | POA: Diagnosis not present

## 2018-05-05 HISTORY — DX: Personal history of irradiation: Z92.3

## 2018-05-25 DIAGNOSIS — M542 Cervicalgia: Secondary | ICD-10-CM | POA: Diagnosis not present

## 2018-05-31 ENCOUNTER — Other Ambulatory Visit: Payer: Self-pay | Admitting: Family Medicine

## 2018-05-31 DIAGNOSIS — M542 Cervicalgia: Secondary | ICD-10-CM

## 2018-06-01 ENCOUNTER — Ambulatory Visit
Admission: RE | Admit: 2018-06-01 | Discharge: 2018-06-01 | Disposition: A | Discharge status: Home / Self Care | Payer: 59 | Source: Ambulatory Visit | Attending: Family Medicine | Admitting: Family Medicine

## 2018-06-01 DIAGNOSIS — M50222 Other cervical disc displacement at C5-C6 level: Secondary | ICD-10-CM | POA: Diagnosis not present

## 2018-06-01 DIAGNOSIS — M542 Cervicalgia: Secondary | ICD-10-CM

## 2018-06-05 ENCOUNTER — Other Ambulatory Visit: Payer: Self-pay | Admitting: Oncology

## 2018-06-15 DIAGNOSIS — I10 Essential (primary) hypertension: Secondary | ICD-10-CM | POA: Diagnosis not present

## 2018-06-15 DIAGNOSIS — Z0000 Encounter for general adult medical examination without abnormal findings: Secondary | ICD-10-CM | POA: Diagnosis not present

## 2018-06-15 DIAGNOSIS — J309 Allergic rhinitis, unspecified: Secondary | ICD-10-CM | POA: Diagnosis not present

## 2018-06-15 DIAGNOSIS — Z23 Encounter for immunization: Secondary | ICD-10-CM | POA: Diagnosis not present

## 2018-06-17 DIAGNOSIS — M542 Cervicalgia: Secondary | ICD-10-CM | POA: Diagnosis not present

## 2018-07-28 DIAGNOSIS — M4802 Spinal stenosis, cervical region: Secondary | ICD-10-CM | POA: Diagnosis not present

## 2018-07-28 DIAGNOSIS — M542 Cervicalgia: Secondary | ICD-10-CM | POA: Diagnosis not present

## 2018-09-05 DIAGNOSIS — M4802 Spinal stenosis, cervical region: Secondary | ICD-10-CM | POA: Diagnosis not present

## 2018-10-11 DIAGNOSIS — L570 Actinic keratosis: Secondary | ICD-10-CM | POA: Diagnosis not present

## 2018-11-02 DIAGNOSIS — D0512 Intraductal carcinoma in situ of left breast: Secondary | ICD-10-CM | POA: Diagnosis not present

## 2018-11-04 ENCOUNTER — Telehealth: Payer: Self-pay | Admitting: Oncology

## 2018-11-04 NOTE — Telephone Encounter (Signed)
R/s appt per 2/7 sch message - pt is aware

## 2019-01-31 ENCOUNTER — Ambulatory Visit: Payer: 59 | Admitting: Oncology

## 2019-03-02 ENCOUNTER — Other Ambulatory Visit: Payer: Self-pay | Admitting: Oncology

## 2019-03-02 DIAGNOSIS — R232 Flushing: Secondary | ICD-10-CM

## 2019-03-27 ENCOUNTER — Other Ambulatory Visit: Payer: Self-pay | Admitting: Adult Health

## 2019-03-27 DIAGNOSIS — Z853 Personal history of malignant neoplasm of breast: Secondary | ICD-10-CM

## 2019-04-03 ENCOUNTER — Inpatient Hospital Stay: Payer: 59 | Attending: Oncology | Admitting: Oncology

## 2019-04-03 ENCOUNTER — Other Ambulatory Visit: Payer: Self-pay

## 2019-04-03 VITALS — BP 114/55 | HR 62 | Temp 98.9°F | Resp 18 | Ht 63.0 in | Wt 110.2 lb

## 2019-04-03 DIAGNOSIS — D0512 Intraductal carcinoma in situ of left breast: Secondary | ICD-10-CM | POA: Insufficient documentation

## 2019-04-03 DIAGNOSIS — Z720 Tobacco use: Secondary | ICD-10-CM

## 2019-04-03 DIAGNOSIS — Z170 Estrogen receptor positive status [ER+]: Secondary | ICD-10-CM | POA: Diagnosis not present

## 2019-04-03 DIAGNOSIS — N951 Menopausal and female climacteric states: Secondary | ICD-10-CM | POA: Diagnosis not present

## 2019-04-03 DIAGNOSIS — C50512 Malignant neoplasm of lower-outer quadrant of left female breast: Secondary | ICD-10-CM | POA: Diagnosis not present

## 2019-04-03 DIAGNOSIS — R232 Flushing: Secondary | ICD-10-CM

## 2019-04-03 DIAGNOSIS — G629 Polyneuropathy, unspecified: Secondary | ICD-10-CM | POA: Insufficient documentation

## 2019-04-03 MED ORDER — CLONIDINE 0.1 MG/24HR TD PTWK: 0.1000 mg | MEDICATED_PATCH | TRANSDERMAL | 12 refills | Status: DC

## 2019-04-03 MED ORDER — GABAPENTIN 100 MG PO CAPS: 200.0000 mg | ORAL_CAPSULE | Freq: Every day | ORAL | 4 refills | Status: DC

## 2019-04-03 MED ORDER — TAMOXIFEN CITRATE 20 MG PO TABS: 20.0000 mg | ORAL_TABLET | Freq: Every day | ORAL | 4 refills | Status: DC

## 2019-04-03 NOTE — Progress Notes (Signed)
[place]  Telephone:(336) [phone] Fax:(336) [phone]     ID: Toni Bowers DOB: March 12, 1966  MR#: [id number]  CZY#:[id number]  Patient Care Team: Darcus Austin, MD (Inactive) as PCP - General (Family Medicine) Rolm Bookbinder, MD as Consulting Physician (General Surgery) Magrinat, Virgie Dad, MD as Consulting Physician (Oncology) Kyung Rudd, MD as Consulting Physician (Radiation Oncology) Haverstock, Jennefer Bravo, MD as Referring Physician (Dermatology) Delice Bison, Charlestine Massed, NP as Nurse Practitioner (Hematology and Oncology) OTHER MD:  CHIEF COMPLAINT: Ductal carcinoma in situ  CURRENT TREATMENT: tamoxifen   BREAST CANCER HISTORY: From the original intake note:  Toni Bowers had routine bilateral screening mammography with tomography at the [facility] 01/05/2017. The breast density was category D. The patient has retropectoral implants in place. There was a possible mass or area of distortion in the right breast and on 01/12/2017 the patient underwent right diagnostic mammography with tomography and ultrasonography. This time the breast density was read as category C.   In the upper outer right breast there was an area of persistent distortion. This was not palpable. This area was sonographically silent and there was no right axillary adenopathy.  In the left breast however there were 2 separate groups of calcifications 1 measuring 0.4 cm in the upper outer quadrant and one measuring 1.0 cm in the lateral left breast.  On 01/19/2017 the patient underwent biopsy of the suspicious area in the upper-outer quadrant of the right breast and this showed ([id number]) a complex sclerosing lesion. On the same day in the left breast the patient had biopsies of the 2 areas of calcification of concern. The one in the upper-outer quadrant was benign. The one in the lower outer quadrant showed ductal carcinoma in situ, high-grade, estrogen receptor positive at 95%, and progesterone  receptor positive at 70%, both with strong staining intensity.  Her subsequent history is as detailed below   INTERVAL HISTORY: Toni Bowers returns today for follow-up and treatment of her ductal carcinoma in situ.  She continues on tamoxifen. She notes that she has horrible hot flashes at night, unaffected by gabapentin.   Since her last visit here, she has not undergone any additional studies. She is scheduled for a diagnostic mammogram on 05/08/2019.     REVIEW OF SYSTEMS: Toni Bowers has been nervous about her aches in her left arm and axilla despite being followed closely and having scans done. For exercise, she walks 5 miles per day. The patient denies unusual headaches, visual changes, nausea, vomiting, or dizziness. There has been no unusual cough, phlegm production, or pleurisy. This been no change in bowel or bladder habits. The patient denies unexplained fatigue or unexplained weight loss, bleeding, rash, or fever. A detailed review of systems was otherwise noncontributory.    PAST MEDICAL HISTORY: Past Medical History:  Diagnosis Date  . Anxiety   . Chronic constipation   . Hypertension   . Internal prolapsed hemorrhoids    and bleeding  . Lactose intolerance   . Personal history of radiation therapy   . Wears glasses     PAST SURGICAL HISTORY: Past Surgical History:  Procedure Laterality Date  . AUGMENTATION MAMMAPLASTY Bilateral 1999   retro-pectoral saline  . BREAST EXCISIONAL BIOPSY Right 01/2017   benign  . BREAST IMPLANT EXCHANGE  01/  2016  . BREAST LUMPECTOMY Left 01/2017  . BREAST LUMPECTOMY WITH RADIOACTIVE SEED LOCALIZATION Left 02/15/2017   Procedure: LEFT BREAST LUMPECTOMY WITH RADIOACTIVE SEED LOCALIZATION X 2;  Surgeon: Rolm Bookbinder, MD;  Location: [facility];  Service: General;  Laterality: Left;  . HEMORRHOID SURGERY N/A 10/17/2015   Procedure: HEMORRHOIDECTOMY;  Surgeon: Jackolyn Confer, MD;  Location: [facility];   Service: General;  Laterality: N/A;  . PLACEMENT OF BREAST IMPLANTS Bilateral 1999  . RADIOACTIVE SEED GUIDED EXCISIONAL BREAST BIOPSY Right 02/15/2017   Procedure: RIGHT BREAST SEED GUIDED EXCISIONAL BREAST BIOPSY;  Surgeon: Rolm Bookbinder, MD;  Location: [place];  Service: General;  Laterality: Right;    FAMILY HISTORY Family History  Problem Relation Age of Onset  . Breast cancer Mother 86  The patient's mother, Toni Bowers, died from breast cancer, recurrent, in 2013 at age 40. The patient's father is still living at age 68 as of May 2018. The patient has one brother, no sisters. There is no other history of breast or ovarian cancer in the family.  GYNECOLOGIC HISTORY:  No LMP recorded. Menarche age 74, the patient is GX P0. She stopped having periods October 2017 but then they resumed March 2018 and came right on time in April 2018.  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 Her husband Toni Bowers runs the [employer] out of [place]. At home is just the 2 of them plus 2 chihuahuas    ADVANCED DIRECTIVES: In place   HEALTH MAINTENANCE: Social History   Tobacco Use  . Smoking status: Current Every Day Smoker    Packs/day: 1.00    Years: 30.00    Pack years: 30.00    Types: Cigarettes  . Smokeless tobacco: Never Used  Substance Use Topics  . Alcohol use: No  . Drug use: No     Colonoscopy: October 2016  PAP:  Bone density: 2004?   Allergies  Allergen Reactions  . Codeine Nausea And Vomiting  . Metoprolol Succinate [Metoprolol] Swelling    Throat swelling  . Zoloft [Sertraline Hcl] Swelling    "throat closes"  . Adhesive [Tape] Itching, Swelling and Rash    Current Outpatient Medications  Medication Sig Dispense Refill  . azelastine (ASTELIN) 0.1 % nasal spray Place 1 spray into both nostrils 2 (two) times daily. Use in each nostril as directed    . Biotin 5000 MCG CAPS Take 1 capsule by  mouth every morning.    . busPIRone (BUSPAR) 10 MG tablet Take 10 mg by mouth daily.     . Calcium Carbonate-Vitamin D (CALCIUM 600+D) 600-400 MG-UNIT tablet Take 1 tablet by mouth daily.    . cloNIDine (CATAPRES-TTS-1) 0.1 mg/24hr patch Place 1 patch (0.1 mg total) onto the skin once a week. 4 patch 12  . gabapentin (NEURONTIN) 100 MG capsule Take 2 capsules (200 mg total) by mouth at bedtime. 180 capsule 4  . ibuprofen (ADVIL,MOTRIN) 200 MG tablet Take 800 mg by mouth every 6 (six) hours as needed.    Marland Kitchen lisinopril (PRINIVIL,ZESTRIL) 5 MG tablet Take 6 mg by mouth daily.     . Multiple Vitamin (MULTIVITAMIN) tablet Take 1 tablet by mouth daily.    . Omega-3 Fatty Acids (FISH OIL) 1200 MG CAPS Take 1 capsule by mouth daily.    . polycarbophil (FIBERCON) 625 MG tablet Take 625 mg by mouth 2 (two) times daily.    . tamoxifen (NOLVADEX) 20 MG tablet Take 1 tablet (20 mg total) by mouth daily. 90 tablet 4  . vitamin B-12 (CYANOCOBALAMIN) 1000 MCG tablet Take 1,000 mcg by mouth daily.     No current facility-administered medications for this  visit.     OBJECTIVE: Middle-aged white woman who appears well  Vitals:   04/03/19 1505  BP: (!) 114/55  Pulse: 62  Resp: 18  Temp: 98.9 F (37.2 C)  SpO2: 100%     Body mass index is 19.52 kg/m.    ECOG FS:0 - Asymptomatic  Sclerae unicteric, pupils round and equal Masked No cervical or supraclavicular adenopathy Lungs no rales or rhonchi Heart regular rate and rhythm Abd soft, nontender, positive bowel sounds MSK no focal spinal tenderness, no upper extremity lymphedema, excellent range of motion left upper extremity Neuro: nonfocal, well oriented, appropriate affect Breasts: Status post bilateral mastectomy and left-sided radiation.  There is no evidence of disease recurrence.  Both axillae are benign.  LAB RESULTS:  CMP     Component Value Date/Time   NA 143 01/27/2017 0852   K 4.1 01/27/2017 0852   CL 102 10/14/2015 1258   CO2 30  (H) 01/27/2017 0852   GLUCOSE 86 01/27/2017 0852   BUN 9.1 01/27/2017 0852   CREATININE 0.8 01/27/2017 0852   CALCIUM 10.8 (H) 01/27/2017 0852   PROT 7.4 01/27/2017 0852   ALBUMIN 4.4 01/27/2017 0852   AST 24 01/27/2017 0852   ALT 21 01/27/2017 0852   ALKPHOS 70 01/27/2017 0852   BILITOT 0.51 01/27/2017 0852   GFRNONAA >60 10/14/2015 1258   GFRAA >60 10/14/2015 1258    No results found for: TOTALPROTELP, ALBUMINELP, A1GS, A2GS, BETS, BETA2SER, GAMS, MSPIKE, SPEI  No results found for: Nils Pyle, [facility]  Lab Results  Component Value Date   WBC 6.9 01/27/2017   NEUTROABS 3.9 01/27/2017   HGB 14.7 01/27/2017   HCT 41.9 01/27/2017   MCV 96.8 01/27/2017   PLT 233 01/27/2017      Chemistry      Component Value Date/Time   NA 143 01/27/2017 0852   K 4.1 01/27/2017 0852   CL 102 10/14/2015 1258   CO2 30 (H) 01/27/2017 0852   BUN 9.1 01/27/2017 0852   CREATININE 0.8 01/27/2017 0852      Component Value Date/Time   CALCIUM 10.8 (H) 01/27/2017 0852   ALKPHOS 70 01/27/2017 0852   AST 24 01/27/2017 0852   ALT 21 01/27/2017 0852   BILITOT 0.51 01/27/2017 0852       No results found for: LABCA2  No components found for: [id number]  No results for input(s): INR in the last 168 hours.  Urinalysis No results found for: COLORURINE, APPEARANCEUR, LABSPEC, PHURINE, GLUCOSEU, HGBUR, BILIRUBINUR, KETONESUR, PROTEINUR, UROBILINOGEN, NITRITE, LEUKOCYTESUR   STUDIES: No results found.   ELIGIBLE FOR AVAILABLE RESEARCH PROTOCOL: no  ASSESSMENT: 53 y.o. [place], [place] woman status post left breast lower outer quadrant biopsy 01/19/2017 for ductal carcinoma in situ, grade 3, estrogen and progesterone receptor positive  (1) right breast upper outer quadrant biopsy 01/19/2017 shows a complex sclerosing lesion  (2) Bilateral lumpectomies 02/15/2017 showed  (a) on the right, only a complex sclerosing lesion  (b) in the left lower outer quadrant  ductal carcinoma in situ measuring 1.5 cm, with negative margins  (3) adjuvant radiation completed 05/04/2017  (4) tamoxifen started 05/10/2017  PLAN: Shamona is now a little over 2 years out from definitive surgery for her noninvasive breast cancer with no evidence of disease recurrence.  This is very favorable.  She is tolerating tamoxifen generally well except for hot flashes.  She was not able to tolerate venlafaxine.  She is able to tolerate 200 mg of gabapentin at bedtime but gets  groggy with 300 mg.  We are going to add TTS 1 patches.  We discussed the possible toxicities side effects and complications of this agent.  She will let me know if she has any difficulty or if it does not work for her.  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  She likely will need further treatment in the future but for now she is essentially asymptomatic.  She will have mammography in August and see me again a year from now  She knows to call for any other issues that may develop before the next visit.   Magrinat, Virgie Dad, MD  04/03/19 3:36 PM Medical Oncology and Hematology [facility] [address] [place], [place] [address] Tel. [phone]    Fax. [phone]  I, Jacqualyn Posey am acting as a Education administrator for Chauncey Cruel, MD.

## 2019-04-04 ENCOUNTER — Telehealth: Payer: Self-pay | Admitting: Oncology

## 2019-04-04 NOTE — Telephone Encounter (Signed)
I talk with patient regarding schedule  

## 2019-05-08 ENCOUNTER — Ambulatory Visit
Admission: RE | Admit: 2019-05-08 | Discharge: 2019-05-08 | Disposition: A | Discharge status: Home / Self Care | Payer: 59 | Source: Ambulatory Visit | Attending: Adult Health | Admitting: Adult Health

## 2019-05-08 ENCOUNTER — Other Ambulatory Visit: Payer: Self-pay | Admitting: Adult Health

## 2019-05-08 ENCOUNTER — Other Ambulatory Visit: Payer: Self-pay

## 2019-05-08 DIAGNOSIS — Z853 Personal history of malignant neoplasm of breast: Secondary | ICD-10-CM

## 2020-03-28 ENCOUNTER — Other Ambulatory Visit: Payer: Self-pay | Admitting: Oncology

## 2020-03-28 DIAGNOSIS — Z853 Personal history of malignant neoplasm of breast: Secondary | ICD-10-CM

## 2020-04-25 ENCOUNTER — Other Ambulatory Visit: Payer: Self-pay | Admitting: Oncology

## 2020-04-25 DIAGNOSIS — R232 Flushing: Secondary | ICD-10-CM

## 2020-05-08 ENCOUNTER — Other Ambulatory Visit: Payer: Self-pay

## 2020-05-08 ENCOUNTER — Ambulatory Visit
Admission: RE | Admit: 2020-05-08 | Discharge: 2020-05-08 | Disposition: A | Discharge status: Home / Self Care | Payer: 59 | Source: Ambulatory Visit | Attending: Oncology | Admitting: Oncology

## 2020-05-08 DIAGNOSIS — Z853 Personal history of malignant neoplasm of breast: Secondary | ICD-10-CM

## 2020-05-21 ENCOUNTER — Other Ambulatory Visit: Payer: Self-pay

## 2020-05-21 ENCOUNTER — Inpatient Hospital Stay: Payer: 59 | Attending: Oncology | Admitting: Oncology

## 2020-05-21 VITALS — BP 108/42 | HR 69 | Temp 98.0°F | Resp 20 | Ht 63.0 in | Wt 107.0 lb

## 2020-05-21 DIAGNOSIS — F17210 Nicotine dependence, cigarettes, uncomplicated: Secondary | ICD-10-CM | POA: Diagnosis not present

## 2020-05-21 DIAGNOSIS — Z170 Estrogen receptor positive status [ER+]: Secondary | ICD-10-CM | POA: Diagnosis not present

## 2020-05-21 DIAGNOSIS — C50512 Malignant neoplasm of lower-outer quadrant of left female breast: Secondary | ICD-10-CM

## 2020-05-21 DIAGNOSIS — D0512 Intraductal carcinoma in situ of left breast: Secondary | ICD-10-CM | POA: Diagnosis not present

## 2020-05-21 DIAGNOSIS — Z923 Personal history of irradiation: Secondary | ICD-10-CM | POA: Diagnosis not present

## 2020-05-21 DIAGNOSIS — R232 Flushing: Secondary | ICD-10-CM

## 2020-05-21 MED ORDER — GABAPENTIN 100 MG PO CAPS: 200.0000 mg | ORAL_CAPSULE | Freq: Every day | ORAL | 6 refills | Status: DC

## 2020-05-21 NOTE — Progress Notes (Signed)
Toni Bowers  [phone] [phone]     ID: Toni Bowers DOB: 1966-04-18  MR#: [id number]  TIW#:[id number]  Patient Care Team: Toni Bowers Toni Dad, MD as PCP - General (Oncology) Toni Bookbinder, MD as Consulting Physician (General Surgery) Toni Bowers, Toni Dad, MD as Consulting Physician (Oncology) Toni Rudd, MD as Consulting Physician (Radiation Oncology) Toni Bowers, Toni Bravo, MD as Referring Physician (Dermatology) Toni Bowers, Toni Massed, NP as Nurse Practitioner (Hematology and Oncology) Toni Hakim, MD as Consulting Physician (Anesthesiology) OTHER MD:  CHIEF COMPLAINT: Ductal carcinoma in situ  CURRENT TREATMENT: tamoxifen   INTERVAL HISTORY: Toni Bowers returns today for follow-up of her ductal carcinoma in situ.  She continues on tamoxifen.  She tried gabapentin initially with some difficulty but by now she has gotten used to it and she would like to increase the dose from 200 to 300 mg at bedtime since it does help her sleep.  She never did start TTS 1 patches.  She is reluctant to try them because she has a history of allergy to adhesives.  Since her last visit, she underwent bilateral diagnostic mammography with tomography at [facility] on 05/08/2020 showing: breast density category D; no evidence of malignancy in either breast.    REVIEW OF SYSTEMS: Toni Bowers walks about 5 miles most days.  She is training her "pandemic puppy" to learn to mix with all the dogs and with people.  Aside from these issues a detailed review of systems today was stable   BREAST CANCER HISTORY: From the original intake note:  Toni Bowers had routine bilateral screening mammography with tomography at [facility] 01/05/2017. The breast density was category D. The patient has retropectoral implants in place. There was a possible mass or area of distortion in the right breast and on 01/12/2017 the patient underwent right diagnostic  mammography with tomography and ultrasonography. This time the breast density was read as category C.   In the upper outer right breast there was an area of persistent distortion. This was not palpable. This area was sonographically silent and there was no right axillary adenopathy.  In the left breast however there were 2 separate groups of calcifications 1 measuring 0.4 cm in the upper outer quadrant and one measuring 1.0 cm in the lateral left breast.  On 01/19/2017 the patient underwent biopsy of the suspicious area in the upper-outer quadrant of the right breast and this showed ([id number]) a complex sclerosing lesion. On the same day in the left breast the patient had biopsies of the 2 areas of calcification of concern. The one in the upper-outer quadrant was benign. The one in the lower outer quadrant showed ductal carcinoma in situ, high-grade, estrogen receptor positive at 95%, and progesterone receptor positive at 70%, both with strong staining intensity.  Her subsequent history is as detailed below   PAST MEDICAL HISTORY: Past Medical History:  Diagnosis Date  . Anxiety   . Breast cancer ([place]) 2018   Left Breast Cancer  . Chronic constipation   . Hypertension   . Internal prolapsed hemorrhoids    and bleeding  . Lactose intolerance   . Personal history of radiation therapy   . Wears glasses     PAST SURGICAL HISTORY: Past Surgical History:  Procedure Laterality Date  . AUGMENTATION MAMMAPLASTY Bilateral 1999   retro-pectoral saline  . BREAST EXCISIONAL BIOPSY Right 01/2017   benign  . BREAST IMPLANT EXCHANGE  01/  2016  . BREAST LUMPECTOMY Left 01/2017  . BREAST LUMPECTOMY  WITH RADIOACTIVE SEED LOCALIZATION Left 02/15/2017   Procedure: LEFT BREAST LUMPECTOMY WITH RADIOACTIVE SEED LOCALIZATION X 2;  Surgeon: Toni Bookbinder, MD;  Location: [facility];  Service: General;  Laterality: Left;  . HEMORRHOID SURGERY N/A 10/17/2015   Procedure:  HEMORRHOIDECTOMY;  Surgeon: Jackolyn Confer, MD;  Location: [facility];  Service: General;  Laterality: N/A;  . PLACEMENT OF BREAST IMPLANTS Bilateral 1999  . RADIOACTIVE SEED GUIDED EXCISIONAL BREAST BIOPSY Right 02/15/2017   Procedure: RIGHT BREAST SEED GUIDED EXCISIONAL BREAST BIOPSY;  Surgeon: Toni Bookbinder, MD;  Location: [facility];  Service: General;  Laterality: Right;    FAMILY HISTORY Family History  Problem Relation Age of Onset  . Breast cancer Mother 4  The patient's mother, Toni Bowers, died from breast cancer, recurrent, in 2013 at age 1. The patient's father is still living at age 76 as of May 2018. The patient has one brother, no sisters. There is no other history of breast or ovarian cancer in the family.   GYNECOLOGIC HISTORY:  Patient's last menstrual period was 10/06/2015 (exact date). Menarche age 44, the patient is GX P0. She stopped having periods October 2017 but then they resumed March 2018 and came right on time in April 2018.   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 Her husband Toni Bowers runs the [employer] out of [place]. At home is just the 2 of them plus 2 chihuahuas    ADVANCED DIRECTIVES: In place   HEALTH MAINTENANCE: Social History   Tobacco Use  . Smoking status: Current Every Day Smoker    Packs/day: 1.00    Years: 30.00    Pack years: 30.00    Types: Cigarettes  . Smokeless tobacco: Never Used  Vaping Use  . Vaping Use: Never used  Substance Use Topics  . Alcohol use: No  . Drug use: No     Colonoscopy: October 2016  PAP:  Bone density: 2004?   Allergies  Allergen Reactions  . Codeine Nausea And Vomiting  . Metoprolol Succinate [Metoprolol] Swelling    Throat swelling  . Zoloft [Sertraline Hcl] Swelling    "throat closes"  . Adhesive [Tape] Itching, Swelling and Rash    Current Outpatient Medications  Medication Sig Dispense  Refill  . azelastine (ASTELIN) 0.1 % nasal spray Place 1 spray into both nostrils 2 (two) times daily. Use in each nostril as directed    . Biotin 5000 MCG CAPS Take 1 capsule by mouth every morning.    . busPIRone (BUSPAR) 10 MG tablet Take 10 mg by mouth daily.     . Calcium Carbonate-Vitamin D (CALCIUM 600+D) 600-400 MG-UNIT tablet Take 1 tablet by mouth daily.    . cloNIDine (CATAPRES-TTS-1) 0.1 mg/24hr patch Place 1 patch (0.1 mg total) onto the skin once a week. 4 patch 12  . gabapentin (NEURONTIN) 100 MG capsule Take 2 capsules (200 mg total) by mouth at bedtime. 60 capsule 6  . ibuprofen (ADVIL,MOTRIN) 200 MG tablet Take 800 mg by mouth every 6 (six) hours as needed.    Marland Kitchen lisinopril (PRINIVIL,ZESTRIL) 5 MG tablet Take 6 mg by mouth daily.     . Multiple Vitamin (MULTIVITAMIN) tablet Take 1 tablet by mouth daily.    . Omega-3 Fatty Acids (FISH OIL) 1200 MG CAPS Take 1 capsule by mouth daily.    . polycarbophil (FIBERCON) 625 MG tablet Take 625 mg by mouth 2 (two) times daily.    Marland Kitchen  tamoxifen (NOLVADEX) 20 MG tablet TAKE ONE TABLET BY MOUTH DAILY 90 tablet 4  . vitamin B-12 (CYANOCOBALAMIN) 1000 MCG tablet Take 1,000 mcg by mouth daily.     No current facility-administered medications for this visit.    OBJECTIVE: White woman who appears younger than stated age  1:   05/21/20 1315  BP: (!) 108/42  Pulse: 69  Resp: 20  Temp: 98 F (36.7 C)  SpO2: 100%     Body mass index is 18.95 kg/m.    ECOG FS:0 - Asymptomatic  Sclerae unicteric, EOMs intact Wearing a mask No cervical or supraclavicular adenopathy Lungs no rales or rhonchi Heart regular rate and rhythm Abd soft, nontender, positive bowel sounds MSK no focal spinal tenderness, no upper extremity lymphedema Neuro: nonfocal, well oriented, appropriate affect Breasts: Status post bilateral mastectomies and left-sided radiation with no evidence of disease recurrence, bilateral reconstruction.  Both axillae are  benign.   LAB RESULTS:  CMP     Component Value Date/Time   NA 143 01/27/2017 0852   K 4.1 01/27/2017 0852   CL 102 10/14/2015 1258   CO2 30 (H) 01/27/2017 0852   GLUCOSE 86 01/27/2017 0852   BUN 9.1 01/27/2017 0852   CREATININE 0.8 01/27/2017 0852   CALCIUM 10.8 (H) 01/27/2017 0852   PROT 7.4 01/27/2017 0852   ALBUMIN 4.4 01/27/2017 0852   AST 24 01/27/2017 0852   ALT 21 01/27/2017 0852   ALKPHOS 70 01/27/2017 0852   BILITOT 0.51 01/27/2017 0852   GFRNONAA >60 10/14/2015 1258   GFRAA >60 10/14/2015 1258    No results found for: TOTALPROTELP, ALBUMINELP, A1GS, A2GS, BETS, BETA2SER, GAMS, MSPIKE, SPEI  No results found for: Nils Pyle, [facility]  Lab Results  Component Value Date   WBC 6.9 01/27/2017   NEUTROABS 3.9 01/27/2017   HGB 14.7 01/27/2017   HCT 41.9 01/27/2017   MCV 96.8 01/27/2017   PLT 233 01/27/2017      Chemistry      Component Value Date/Time   NA 143 01/27/2017 0852   K 4.1 01/27/2017 0852   CL 102 10/14/2015 1258   CO2 30 (H) 01/27/2017 0852   BUN 9.1 01/27/2017 0852   CREATININE 0.8 01/27/2017 0852      Component Value Date/Time   CALCIUM 10.8 (H) 01/27/2017 0852   ALKPHOS 70 01/27/2017 0852   AST 24 01/27/2017 0852   ALT 21 01/27/2017 0852   BILITOT 0.51 01/27/2017 0852       No results found for: LABCA2  No components found for: [id number]  No results for input(s): INR in the last 168 hours.  Urinalysis No results found for: COLORURINE, APPEARANCEUR, LABSPEC, PHURINE, GLUCOSEU, HGBUR, BILIRUBINUR, [place], PROTEINUR, UROBILINOGEN, NITRITE, LEUKOCYTESUR   STUDIES: MM DIAG BREAST W/IMPLANT TOMO BILATERAL  Result Date: 05/08/2020 CLINICAL DATA:  Status post left lumpectomy and radiation therapy for breast cancer in 2018. EXAM: DIGITAL DIAGNOSTIC BILATERAL MAMMOGRAM WITH IMPLANTS, CAD AND TOMO The patient has retropectoral implants. Standard and implant displaced views were performed. COMPARISON:  Previous  exam(s). ACR Breast Density Category d: The breast tissue is extremely dense, which lowers the sensitivity of mammography. FINDINGS: Stable post lumpectomy changes on the left. No interval findings suspicious for malignancy in either breast. Mammographic images were processed with CAD. IMPRESSION: No evidence of malignancy. RECOMMENDATION: Bilateral diagnostic mammogram in 1 year. I have discussed the findings and recommendations with the patient. If applicable, a reminder letter will be sent to the patient regarding the next appointment. BI-RADS  CATEGORY  2: Benign. Electronically Signed   By: Claudie Revering M.D.   On: 05/08/2020 15:54     ELIGIBLE FOR AVAILABLE RESEARCH PROTOCOL: no  ASSESSMENT: 54 y.o. [place], [place] woman status post left breast lower outer quadrant biopsy 01/19/2017 for ductal carcinoma in situ, grade 3, estrogen and progesterone receptor positive  (1) right breast upper outer quadrant biopsy 01/19/2017 shows a complex sclerosing lesion  (2) Bilateral lumpectomies 02/15/2017 showed  (a) on the right, only a complex sclerosing lesion  (b) in the left lower outer quadrant ductal carcinoma in situ measuring 1.5 cm, with negative margins  (3) adjuvant radiation completed 05/04/2017  (4) tamoxifen started 05/10/2017   PLAN: Echo is now a little over 3 years out from definitive surgery for breast cancer with no evidence of disease recurrence.  This is very favorable.  She is tolerating tamoxifen well and the plan is to continue that a total of 5 years, to be completed July 2023.  We reviewed her mammography which is very favorable.  A concern of mammography much less sensitive.  We discussed adding an MRI in addition to yearly mammography.  We discussed the possibility of false positives as well as cost issues.  We decided that if her breasts are still a the density next year, after being on tamoxifen for years, we will consider yearly MRIs in addition to  mammography.    I have refilled her gabapentin so she may take 2 or 3 tablets at bedtime  She will see me again in 1 year.  She knows to call for any other issue that may develop before the next visit.    Total encounter time 20 minutes.*  Preesha Benjamin, Toni Dad, MD  05/21/20 1:40 PM Medical Oncology and Hematology [facility] [place], [place] [address] Tel. [phone]    Fax. [phone]   I, Wilburn Mylar, am acting as scribe for Dr. Virgie Bowers. Nieve Rojero.      *Total Encounter Time as defined by the Centers for Medicare and Medicaid Services includes, in addition to the face-to-face time of a patient visit (documented in the note above) non-face-to-face time: obtaining and reviewing outside history, ordering and reviewing medications, tests or procedures, care coordination (communications with other health care professionals or caregivers) and documentation in the medical record.

## 2020-07-09 ENCOUNTER — Other Ambulatory Visit: Payer: Self-pay | Admitting: Family Medicine

## 2020-07-09 ENCOUNTER — Other Ambulatory Visit (HOSPITAL_COMMUNITY)
Admission: RE | Admit: 2020-07-09 | Discharge: 2020-07-09 | Disposition: A | Discharge status: Home / Self Care | Payer: 59 | Source: Ambulatory Visit | Attending: Family Medicine | Admitting: Family Medicine

## 2020-07-09 DIAGNOSIS — Z01411 Encounter for gynecological examination (general) (routine) with abnormal findings: Secondary | ICD-10-CM | POA: Diagnosis present

## 2020-07-15 LAB — CYTOLOGY - PAP
Comment: NEGATIVE
Diagnosis: NEGATIVE
High risk HPV: NEGATIVE

## 2021-03-28 ENCOUNTER — Other Ambulatory Visit: Payer: Self-pay | Admitting: Oncology

## 2021-03-28 DIAGNOSIS — Z853 Personal history of malignant neoplasm of breast: Secondary | ICD-10-CM

## 2021-04-28 ENCOUNTER — Telehealth: Payer: Self-pay | Admitting: Oncology

## 2021-04-28 NOTE — Telephone Encounter (Signed)
Scheduled appointment per provider. Patient is aware. 

## 2021-05-09 ENCOUNTER — Ambulatory Visit
Admission: RE | Admit: 2021-05-09 | Discharge: 2021-05-09 | Disposition: A | Discharge status: Home / Self Care | Payer: 59 | Source: Ambulatory Visit | Attending: Oncology | Admitting: Oncology

## 2021-05-09 ENCOUNTER — Other Ambulatory Visit: Payer: Self-pay

## 2021-05-09 DIAGNOSIS — Z853 Personal history of malignant neoplasm of breast: Secondary | ICD-10-CM

## 2021-05-20 ENCOUNTER — Other Ambulatory Visit: Payer: Self-pay | Admitting: Oncology

## 2021-05-29 ENCOUNTER — Ambulatory Visit: Payer: 59 | Admitting: Oncology

## 2021-06-08 NOTE — Progress Notes (Addendum)
[place]  [phone] [phone]     ID: Toni Bowers DOB: [birth year]  MR#: [id number]  [id number]  Patient Care Team: Chauncey Cruel, MD as PCP - General (Oncology) Rolm Bookbinder, MD as Consulting Physician (General Surgery) Keiran Sias, Virgie Dad, MD as Consulting Physician (Oncology) Kyung Rudd, MD as Consulting Physician (Radiation Oncology) [place], Jennefer Bravo, MD as Referring Physician (Dermatology) Delice Bison, Charlestine Massed, NP as Nurse Practitioner (Hematology and Oncology) Clydell Hakim, MD (Inactive) as Consulting Physician (Anesthesiology) OTHER MD:  CHIEF COMPLAINT: Ductal carcinoma in situ  CURRENT TREATMENT: tamoxifen   INTERVAL HISTORY: Toni Bowers returns today for follow-up of her ductal carcinoma in situ.  She continues on tamoxifen.  Hot flashes are the major side effect.  For some reason these went away and she says in April 2022 (they may have been related to her use of the treadmill on a daily basis).  Indicates that are not a major issue for her  Since her last visit, she underwent bilateral diagnostic mammography with tomography at [facility] on 05/09/2021 showing: breast density category D; no evidence of malignancy in either breast.    REVIEW OF SYSTEMS: Toni Bowers is not doing the treadmill but still manages to get about 10,000 steps most days.  She has gone back to work.  A detailed review of systems today was otherwise stable   COVID 19 VACCINATION STATUS: Vaccinated x2 plus1 booster as of September 2022, in addition she had COVID June 2022   BREAST CANCER HISTORY: From the original intake note:  Toni Bowers had routine bilateral screening mammography with tomography at the [facility] 01/05/2017. The breast density was category D. The patient has retropectoral implants in place. There was a possible mass or area of distortion in the right breast and on 01/12/2017 the patient underwent right  diagnostic mammography with tomography and ultrasonography. This time the breast density was read as category C.   In the upper outer right breast there was an area of persistent distortion. This was not palpable. This area was sonographically silent and there was no right axillary adenopathy.  In the left breast however there were 2 separate groups of calcifications 1 measuring 0.4 cm in the upper outer quadrant and one measuring 1.0 cm in the lateral left breast.  On 01/19/2017 the patient underwent biopsy of the suspicious area in the upper-outer quadrant of the right breast and this showed ([id number]) a complex sclerosing lesion. On the same day in the left breast the patient had biopsies of the 2 areas of calcification of concern. The one in the upper-outer quadrant was benign. The one in the lower outer quadrant showed ductal carcinoma in situ, high-grade, estrogen receptor positive at 95%, and progesterone receptor positive at 70%, both with strong staining intensity.  Her subsequent history is as detailed below   PAST MEDICAL HISTORY: Past Medical History:  Diagnosis Date   Anxiety    Breast cancer ([place]) 2018   Left Breast Cancer   Chronic constipation    Hypertension    Internal prolapsed hemorrhoids    and bleeding   Lactose intolerance    Personal history of radiation therapy    Wears glasses     PAST SURGICAL HISTORY: Past Surgical History:  Procedure Laterality Date   AUGMENTATION MAMMAPLASTY Bilateral 1999   retro-pectoral saline   BREAST EXCISIONAL BIOPSY Right 01/2017   benign   BREAST IMPLANT EXCHANGE  01/  2016   BREAST LUMPECTOMY Left 01/2017   BREAST  LUMPECTOMY WITH RADIOACTIVE SEED LOCALIZATION Left 02/15/2017   Procedure: LEFT BREAST LUMPECTOMY WITH RADIOACTIVE SEED LOCALIZATION X 2;  Surgeon: Rolm Bookbinder, MD;  Location: [facility];  Service: General;  Laterality: Left;   HEMORRHOID SURGERY N/A 10/17/2015   Procedure: HEMORRHOIDECTOMY;   Surgeon: Toni Confer, MD;  Location: [facility];  Service: General;  Laterality: N/A;   PLACEMENT OF BREAST IMPLANTS Bilateral 1999   RADIOACTIVE SEED GUIDED EXCISIONAL BREAST BIOPSY Right 02/15/2017   Procedure: RIGHT BREAST SEED GUIDED EXCISIONAL BREAST BIOPSY;  Surgeon: Rolm Bookbinder, MD;  Location: [place];  Service: General;  Laterality: Right;    FAMILY HISTORY Family History  Problem Relation Age of Onset   Breast cancer Mother 28  The patient's mother, Toni Bowers, died from breast cancer, recurrent, in 2013 at age 58. The patient's father is still living at age 9 as of May 2018. The patient has one brother, no sisters. There is no other history of breast or ovarian cancer in the family.   GYNECOLOGIC HISTORY:  Patient's last menstrual period was 10/06/2015 (exact date). Menarche age 45, the patient is GX P0. She stopped having periods October 2017 but then they resumed March 2018 and came right on time in April 2018.   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  Her husband Toni Bowers runs the [employer] out of [place]. At home is just the 2 of them plus 2 chihuahuas    ADVANCED DIRECTIVES: In place   HEALTH MAINTENANCE: Social History   Tobacco Use   Smoking status: Every Day    Packs/day: 1.00    Years: 30.00    Pack years: 30.00    Types: Cigarettes   Smokeless tobacco: Never  Vaping Use   Vaping Use: Never used  Substance Use Topics   Alcohol use: No   Drug use: No     Colonoscopy: October 2016  PAP:  Bone density: 2004?   Allergies  Allergen Reactions   Codeine Nausea And Vomiting   Metoprolol Succinate [Metoprolol] Swelling    Throat swelling   Zoloft [Sertraline Hcl] Swelling    "throat closes"   Adhesive [Tape] Itching, Swelling and Rash    Current Outpatient Medications  Medication Sig  Dispense Refill   azelastine (ASTELIN) 0.1 % nasal spray Place 1 spray into both nostrils 2 (two) times daily. Use in each nostril as directed     Biotin 5000 MCG CAPS Take 1 capsule by mouth every morning.     busPIRone (BUSPAR) 10 MG tablet Take 10 mg by mouth daily.      Calcium Carbonate-Vitamin D (CALCIUM 600+D) 600-400 MG-UNIT tablet Take 1 tablet by mouth daily.     cloNIDine (CATAPRES-TTS-1) 0.1 mg/24hr patch Place 1 patch (0.1 mg total) onto the skin once a week. 4 patch 12   gabapentin (NEURONTIN) 100 MG capsule Take 2-3 capsules (200-300 mg total) by mouth at bedtime. 180 capsule 6   ibuprofen (ADVIL,MOTRIN) 200 MG tablet Take 800 mg by mouth every 6 (six) hours as needed.     lisinopril (PRINIVIL,ZESTRIL) 5 MG tablet Take 6 mg by mouth daily.      Multiple Vitamin (MULTIVITAMIN) tablet Take 1 tablet by mouth daily.     Omega-3 Fatty Acids (FISH OIL) 1200 MG CAPS Take 1 capsule by mouth daily.     polycarbophil (FIBERCON) 625 MG  tablet Take 625 mg by mouth 2 (two) times daily.     tamoxifen (NOLVADEX) 20 MG tablet TAKE ONE TABLET BY MOUTH DAILY 90 tablet 3   vitamin B-12 (CYANOCOBALAMIN) 1000 MCG tablet Take 1,000 mcg by mouth daily.     No current facility-administered medications for this visit.    OBJECTIVE: White woman who appears younger than stated age  55:   06/09/21 1357  BP: (!) 120/49  Pulse: 63  Resp: 20  Temp: (!) 97.3 F (36.3 C)  SpO2: 100%      Body mass index is 18.46 kg/m.    ECOG FS:0 - Asymptomatic  Sclerae unicteric, EOMs intact Wearing a mask No cervical or supraclavicular adenopathy Lungs no rales or rhonchi Heart regular rate and rhythm Abd soft, nontender, positive bowel sounds MSK no focal spinal tenderness, no upper extremity lymphedema Neuro: nonfocal, well oriented, appropriate affect Breasts: Status post bilateral mastectomies and radiation to the left side.  Status post bilateral reconstruction.  There is no evidence of local  recurrence.  Both axillae are benign.   LAB RESULTS:  CMP     Component Value Date/Time   NA 143 01/27/2017 0852   K 4.1 01/27/2017 0852   CL 102 10/14/2015 1258   CO2 30 (H) 01/27/2017 0852   GLUCOSE 86 01/27/2017 0852   BUN 9.1 01/27/2017 0852   CREATININE 0.8 01/27/2017 0852   CALCIUM 10.8 (H) 01/27/2017 0852   PROT 7.4 01/27/2017 0852   ALBUMIN 4.4 01/27/2017 0852   AST 24 01/27/2017 0852   ALT 21 01/27/2017 0852   ALKPHOS 70 01/27/2017 0852   BILITOT 0.51 01/27/2017 0852   GFRNONAA >60 10/14/2015 1258   GFRAA >60 10/14/2015 1258    No results found for: TOTALPROTELP, ALBUMINELP, A1GS, A2GS, BETS, BETA2SER, GAMS, MSPIKE, SPEI  No results found for: Nils Pyle, [facility]  Lab Results  Component Value Date   WBC 6.9 01/27/2017   NEUTROABS 3.9 01/27/2017   HGB 14.7 01/27/2017   HCT 41.9 01/27/2017   MCV 96.8 01/27/2017   PLT 233 01/27/2017      Chemistry      Component Value Date/Time   NA 143 01/27/2017 0852   K 4.1 01/27/2017 0852   CL 102 10/14/2015 1258   CO2 30 (H) 01/27/2017 0852   BUN 9.1 01/27/2017 0852   CREATININE 0.8 01/27/2017 0852      Component Value Date/Time   CALCIUM 10.8 (H) 01/27/2017 0852   ALKPHOS 70 01/27/2017 0852   AST 24 01/27/2017 0852   ALT 21 01/27/2017 0852   BILITOT 0.51 01/27/2017 0852       No results found for: LABCA2  No components found for: [id number]  No results for input(s): INR in the last 168 hours.  Urinalysis No results found for: COLORURINE, APPEARANCEUR, LABSPEC, PHURINE, GLUCOSEU, HGBUR, BILIRUBINUR, KETONESUR, PROTEINUR, UROBILINOGEN, NITRITE, LEUKOCYTESUR   STUDIES: No results found.   ELIGIBLE FOR AVAILABLE RESEARCH PROTOCOL: no  ASSESSMENT: 55 y.o. [place], [place] woman status post left breast lower outer quadrant biopsy 01/19/2017 for ductal carcinoma in situ, grade 3, estrogen and progesterone receptor positive  (1) right breast upper outer quadrant biopsy  01/19/2017 shows a complex sclerosing lesion  (2) Bilateral lumpectomies 02/15/2017 showed  (a) on the right, only a complex sclerosing lesion  (b) in the left lower outer quadrant ductal carcinoma in situ measuring 1.5 cm, with negative margins  (3) adjuvant radiation completed 05/04/2017  (4) tamoxifen started 05/10/2017  (5) breast density D.  PLAN: Toni Bowers is now a little over 4 years out from definitive surgery for her breast cancer with no evidence of disease recurrence.  This is very favorable.  She is tolerating tamoxifen well and the plan is to continue that 1 more year.  We again discussed the issue of breast density.  We discussed proceeding to MRI either on a regular basis or at least once.  We discussed cost issues and more importantly false positive issues.  At this point she would like to do pretty much what she did last year, namely wait another year and get her mammogram and if it still breast density D then consider an MRI.  Normally we do not continue tamoxifen beyond 5 years and noninvasive cases.  The advantage of continuing tamoxifen beyond 5 years of course is the ability to use vaginal estrogens if needed and benefits for bone density.  Of course tamoxifen might accelerate breast density loss.  At this point she does not think she will be interested in continuing tamoxifen even if her breast density remains high  Finally she is concerned about early menopausal hair loss.  (This might also be due to her COVID infection June 2022).  I suggested she discuss with her dermatologist the use of low-dose minoxidil.  Total encounter time 25 minutes.*   Toni Bowers, Virgie Dad, MD  06/09/21 2:13 PM Medical Oncology and Hematology [facility] [place], [place] [address] Tel. [phone]    Fax. [phone]   I, Wilburn Mylar, am acting as scribe for Dr. Virgie Dad. Toni Bowers.  I, Lurline Del MD, have reviewed the above documentation for  accuracy and completeness, and I agree with the above.   *Total Encounter Time as defined by the Centers for Medicare and Medicaid Services includes, in addition to the face-to-face time of a patient visit (documented in the note above) non-face-to-face time: obtaining and reviewing outside history, ordering and reviewing medications, tests or procedures, care coordination (communications with other health care professionals or caregivers) and documentation in the medical record.

## 2021-06-09 ENCOUNTER — Inpatient Hospital Stay: Payer: 59 | Attending: Oncology | Admitting: Oncology

## 2021-06-09 ENCOUNTER — Other Ambulatory Visit: Payer: Self-pay

## 2021-06-09 VITALS — BP 120/49 | HR 63 | Temp 97.3°F | Resp 20 | Wt 104.2 lb

## 2021-06-09 DIAGNOSIS — C50512 Malignant neoplasm of lower-outer quadrant of left female breast: Secondary | ICD-10-CM

## 2021-06-09 DIAGNOSIS — Z923 Personal history of irradiation: Secondary | ICD-10-CM | POA: Insufficient documentation

## 2021-06-09 DIAGNOSIS — D0512 Intraductal carcinoma in situ of left breast: Secondary | ICD-10-CM | POA: Diagnosis not present

## 2021-06-09 DIAGNOSIS — Z8616 Personal history of COVID-19: Secondary | ICD-10-CM | POA: Insufficient documentation

## 2021-06-09 DIAGNOSIS — F17210 Nicotine dependence, cigarettes, uncomplicated: Secondary | ICD-10-CM | POA: Insufficient documentation

## 2021-06-09 DIAGNOSIS — Z170 Estrogen receptor positive status [ER+]: Secondary | ICD-10-CM

## 2021-06-09 MED ORDER — TAMOXIFEN CITRATE 20 MG PO TABS: 20.0000 mg | ORAL_TABLET | Freq: Every day | ORAL | 3 refills | Status: DC

## 2021-06-19 ENCOUNTER — Other Ambulatory Visit: Payer: Self-pay | Admitting: Oncology

## 2021-06-19 DIAGNOSIS — R232 Flushing: Secondary | ICD-10-CM

## 2021-10-20 ENCOUNTER — Other Ambulatory Visit: Payer: Self-pay

## 2021-10-20 DIAGNOSIS — R232 Flushing: Secondary | ICD-10-CM

## 2021-10-20 MED ORDER — GABAPENTIN 100 MG PO CAPS: ORAL_CAPSULE | ORAL | 0 refills | Status: DC

## 2021-10-21 ENCOUNTER — Other Ambulatory Visit: Payer: Self-pay | Admitting: *Deleted

## 2021-10-21 DIAGNOSIS — R232 Flushing: Secondary | ICD-10-CM

## 2021-10-21 MED ORDER — GABAPENTIN 100 MG PO CAPS: ORAL_CAPSULE | ORAL | 0 refills | Status: DC

## 2021-12-16 ENCOUNTER — Other Ambulatory Visit: Payer: Self-pay | Admitting: *Deleted

## 2021-12-16 DIAGNOSIS — R232 Flushing: Secondary | ICD-10-CM

## 2021-12-16 MED ORDER — GABAPENTIN 100 MG PO CAPS: ORAL_CAPSULE | ORAL | 0 refills | Status: DC

## 2021-12-18 ENCOUNTER — Other Ambulatory Visit: Payer: Self-pay | Admitting: *Deleted

## 2022-01-22 ENCOUNTER — Other Ambulatory Visit: Payer: Self-pay | Admitting: *Deleted

## 2022-01-22 DIAGNOSIS — R232 Flushing: Secondary | ICD-10-CM

## 2022-01-22 MED ORDER — GABAPENTIN 100 MG PO CAPS: ORAL_CAPSULE | ORAL | 3 refills | Status: DC

## 2022-04-02 ENCOUNTER — Other Ambulatory Visit: Payer: Self-pay | Admitting: Family Medicine

## 2022-04-02 DIAGNOSIS — Z1231 Encounter for screening mammogram for malignant neoplasm of breast: Secondary | ICD-10-CM

## 2022-05-13 ENCOUNTER — Ambulatory Visit
Admission: RE | Admit: 2022-05-13 | Discharge: 2022-05-13 | Disposition: A | Discharge status: Home / Self Care | Payer: 59 | Source: Ambulatory Visit | Attending: Family Medicine | Admitting: Family Medicine

## 2022-05-13 DIAGNOSIS — Z1231 Encounter for screening mammogram for malignant neoplasm of breast: Secondary | ICD-10-CM

## 2022-06-16 ENCOUNTER — Inpatient Hospital Stay: Payer: 59 | Attending: Hematology and Oncology | Admitting: Hematology and Oncology

## 2022-06-16 ENCOUNTER — Other Ambulatory Visit: Payer: Self-pay

## 2022-06-16 ENCOUNTER — Encounter: Payer: Self-pay | Admitting: Hematology and Oncology

## 2022-06-16 VITALS — BP 123/56 | HR 76 | Temp 97.7°F | Resp 16 | Ht 63.0 in | Wt 105.1 lb

## 2022-06-16 DIAGNOSIS — Z170 Estrogen receptor positive status [ER+]: Secondary | ICD-10-CM | POA: Diagnosis not present

## 2022-06-16 DIAGNOSIS — Z923 Personal history of irradiation: Secondary | ICD-10-CM | POA: Diagnosis not present

## 2022-06-16 DIAGNOSIS — Z86000 Personal history of in-situ neoplasm of breast: Secondary | ICD-10-CM | POA: Insufficient documentation

## 2022-06-16 DIAGNOSIS — C50512 Malignant neoplasm of lower-outer quadrant of left female breast: Secondary | ICD-10-CM | POA: Diagnosis not present

## 2022-06-16 NOTE — Progress Notes (Signed)
[place]  [phone] Fax:(336) [phone]     ID: Toni Bowers DOB: [birth year]  MR#: [id number]  PYK#:[id number]  Patient Care Team: Toni Bowers Toni Dad, MD (Inactive) as PCP - General (Oncology) Toni Bookbinder, MD as Consulting Physician (General Surgery) Toni Bowers, Toni Dad, MD (Inactive) as Consulting Physician (Oncology) Toni Rudd, MD as Consulting Physician (Radiation Oncology) Toni Bowers, Toni Bravo, MD as Referring Physician (Dermatology) Toni Bowers, Toni Massed, NP as Nurse Practitioner (Hematology and Oncology) Toni Hakim, MD (Inactive) as Consulting Physician (Anesthesiology) OTHER MD:  CHIEF COMPLAINT: Ductal carcinoma in situ  CURRENT TREATMENT: tamoxifen   INTERVAL HISTORY: Toni Bowers returns today for follow-up of her ductal carcinoma in situ. Toni Bowers is now done with 5 years of tamoxifen. She is excited to come off of tamoxifen.  She denies any major health complaints today She did asked me that she has a [facility] PCP who she has not met.  Hence she would like to continue annual follow-ups in the breast clinic.  She is not quite sure if she wants to consider MRI screening at this time.  She is worried about the side effects of MRI long-term. Rest of the pertinent 10 point ROS reviewed and negative  BREAST CANCER HISTORY: From the original intake note:  Toni Bowers had routine bilateral screening mammography with tomography at the [facility] 01/05/2017. The breast density was category D. The patient has retropectoral implants in place. There was a possible mass or area of distortion in the right breast and on 01/12/2017 the patient underwent right diagnostic mammography with tomography and ultrasonography. This time the breast density was read as category C.   In the upper outer right breast there was an area of persistent distortion. This was not palpable. This area was sonographically silent and there was no right axillary  adenopathy.  In the left breast however there were 2 separate groups of calcifications 1 measuring 0.4 cm in the upper outer quadrant and one measuring 1.0 cm in the lateral left breast.  On 01/19/2017 the patient underwent biopsy of the suspicious area in the upper-outer quadrant of the right breast and this showed ([id number]) a complex sclerosing lesion. On the same day in the left breast the patient had biopsies of the 2 areas of calcification of concern. The one in the upper-outer quadrant was benign. The one in the lower outer quadrant showed ductal carcinoma in situ, high-grade, estrogen receptor positive at 95%, and progesterone receptor positive at 70%, both with strong staining intensity.  Her subsequent history is as detailed below   PAST MEDICAL HISTORY: Past Medical History:  Diagnosis Date   Anxiety    Breast cancer ([place]) 2018   Left Breast Cancer   Chronic constipation    Hypertension    Internal prolapsed hemorrhoids    and bleeding   Lactose intolerance    Personal history of radiation therapy    Wears glasses     PAST SURGICAL HISTORY: Past Surgical History:  Procedure Laterality Date   AUGMENTATION MAMMAPLASTY Bilateral 1999   retro-pectoral saline   BREAST EXCISIONAL BIOPSY Right 01/2017   benign   BREAST IMPLANT EXCHANGE  01/  2016   BREAST LUMPECTOMY Left 01/2017   BREAST LUMPECTOMY WITH RADIOACTIVE SEED LOCALIZATION Left 02/15/2017   Procedure: LEFT BREAST LUMPECTOMY WITH RADIOACTIVE SEED LOCALIZATION X 2;  Surgeon: Toni Bookbinder, MD;  Location: [facility];  Service: General;  Laterality: Left;   HEMORRHOID SURGERY N/A 10/17/2015   Procedure: HEMORRHOIDECTOMY;  Surgeon: Toni Mocha  Rosenbower, MD;  Location: [facility];  Service: General;  Laterality: N/A;   PLACEMENT OF BREAST IMPLANTS Bilateral 1999   RADIOACTIVE SEED GUIDED EXCISIONAL BREAST BIOPSY Right 02/15/2017   Procedure: RIGHT BREAST SEED GUIDED EXCISIONAL BREAST  BIOPSY;  Surgeon: Toni Bookbinder, MD;  Location: [facility];  Service: General;  Laterality: Right;    FAMILY HISTORY Family History  Problem Relation Age of Onset   Breast cancer Mother 39  The patient's mother, Toni Bowers, died from breast cancer, recurrent, in 2013 at age 58. The patient's father is still living at age 54 as of May 2018. The patient has one brother, no sisters. There is no other history of breast or ovarian cancer in the family.   GYNECOLOGIC HISTORY:  Patient's last menstrual period was 10/06/2015 (exact date). Menarche age 64, the patient is GX P0. She stopped having periods October 2017 but then they resumed March 2018 and came right on time in April 2018.   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  Her husband Toni Bowers runs the [employer] out of [place]. At home is just the 2 of them plus 2 chihuahuas    ADVANCED DIRECTIVES: In place   HEALTH MAINTENANCE: Social History   Tobacco Use   Smoking status: Every Day    Packs/day: 1.00    Years: 30.00    Total pack years: 30.00    Types: Cigarettes   Smokeless tobacco: Never  Vaping Use   Vaping Use: Never used  Substance Use Topics   Alcohol use: No   Drug use: No     Colonoscopy: October 2016  PAP:  Bone density: 2004?   Allergies  Allergen Reactions   Codeine Nausea And Vomiting   Metoprolol Succinate [Metoprolol] Swelling    Throat swelling   Zoloft [Sertraline Hcl] Swelling    "throat closes"   Adhesive [Tape] Itching, Swelling and Rash    Current Outpatient Medications  Medication Sig Dispense Refill   azelastine (ASTELIN) 0.1 % nasal spray Place 1 spray into both nostrils 2 (two) times daily. Use in each nostril as directed     Biotin 5000 MCG CAPS Take 1 capsule by mouth every morning.     busPIRone (BUSPAR) 10 MG tablet Take 10 mg by mouth daily.       Calcium Carbonate-Vitamin D (CALCIUM 600+D) 600-400 MG-UNIT tablet Take 1 tablet by mouth daily.     cloNIDine (CATAPRES-TTS-1) 0.1 mg/24hr patch Place 1 patch (0.1 mg total) onto the skin once a week. 4 patch 12   gabapentin (NEURONTIN) 100 MG capsule Take two to three capsules by mouth every night at bedtime 90 capsule 3   ibuprofen (ADVIL,MOTRIN) 200 MG tablet Take 800 mg by mouth every 6 (six) hours as needed.     lisinopril (PRINIVIL,ZESTRIL) 5 MG tablet Take 6 mg by mouth daily.      Multiple Vitamin (MULTIVITAMIN) tablet Take 1 tablet by mouth daily.     Omega-3 Fatty Acids (FISH OIL) 1200 MG CAPS Take 1 capsule by mouth daily.     polycarbophil (FIBERCON) 625 MG tablet Take 625 mg by mouth 2 (two) times daily.     tamoxifen (NOLVADEX) 20 MG tablet Take 1 tablet (20 mg total) by mouth daily. 90 tablet 3   vitamin B-12 (CYANOCOBALAMIN) 1000 MCG tablet Take 1,000 mcg by mouth daily.  No current facility-administered medications for this visit.    OBJECTIVE: White woman who appears younger than stated age  29:   06/16/22 1305  BP: (!) 123/56  Pulse: 76  Resp: 16  Temp: 97.7 F (36.5 C)  SpO2: 100%      Body mass index is 18.62 kg/m.    ECOG FS:0 - Asymptomatic  Physical Exam Constitutional:      Appearance: Normal appearance.  Chest:     Comments: Bilateral breasts inspected.  No palpable masses or regional adenopathy Musculoskeletal:     Cervical back: Normal range of motion and neck supple. No rigidity.  Lymphadenopathy:     Cervical: No cervical adenopathy.  Skin:    General: Skin is warm and dry.  Neurological:     Mental Status: She is alert.     LAB RESULTS:  CMP     Component Value Date/Time   NA 143 01/27/2017 0852   K 4.1 01/27/2017 0852   CL 102 10/14/2015 1258   CO2 30 (H) 01/27/2017 0852   GLUCOSE 86 01/27/2017 0852   BUN 9.1 01/27/2017 0852   CREATININE 0.8 01/27/2017 0852   CALCIUM 10.8 (H) 01/27/2017 0852   PROT 7.4 01/27/2017  0852   ALBUMIN 4.4 01/27/2017 0852   AST 24 01/27/2017 0852   ALT 21 01/27/2017 0852   ALKPHOS 70 01/27/2017 0852   BILITOT 0.51 01/27/2017 0852   GFRNONAA >60 10/14/2015 1258   GFRAA >60 10/14/2015 1258    No results found for: "TOTALPROTELP", "ALBUMINELP", "A1GS", "A2GS", "BETS", "BETA2SER", "GAMS", "MSPIKE", "SPEI"  No results found for: "KPAFRELGTCHN", "LAMBDASER", "KAPLAMBRATIO"  Lab Results  Component Value Date   WBC 6.9 01/27/2017   NEUTROABS 3.9 01/27/2017   HGB 14.7 01/27/2017   HCT 41.9 01/27/2017   MCV 96.8 01/27/2017   PLT 233 01/27/2017      Chemistry      Component Value Date/Time   NA 143 01/27/2017 0852   K 4.1 01/27/2017 0852   CL 102 10/14/2015 1258   CO2 30 (H) 01/27/2017 0852   BUN 9.1 01/27/2017 0852   CREATININE 0.8 01/27/2017 0852      Component Value Date/Time   CALCIUM 10.8 (H) 01/27/2017 0852   ALKPHOS 70 01/27/2017 0852   AST 24 01/27/2017 0852   ALT 21 01/27/2017 0852   BILITOT 0.51 01/27/2017 0852       No results found for: "LABCA2"  No components found for: "[id number]"  No results for input(s): "INR" in the last 168 hours.  Urinalysis No results found for: "COLORURINE", "APPEARANCEUR", "LABSPEC", "PHURINE", "GLUCOSEU", "HGBUR", "BILIRUBINUR", "KETONESUR", "PROTEINUR", "UROBILINOGEN", "NITRITE", "LEUKOCYTESUR"   STUDIES: No results found.   ELIGIBLE FOR AVAILABLE RESEARCH PROTOCOL: no  ASSESSMENT: 56 y.o. Toni Bowers, [facility] Bowers woman status post left breast lower outer quadrant biopsy 01/19/2017 for ductal carcinoma in situ, grade 3, estrogen and progesterone receptor positive  (1) right breast upper outer quadrant biopsy 01/19/2017 shows a complex sclerosing lesion  (2) Bilateral lumpectomies 02/15/2017 showed  (a) on the right, only a complex sclerosing lesion  (b) in the left lower outer quadrant ductal carcinoma in situ measuring 1.5 cm, with negative margins  (3) adjuvant radiation completed 05/04/2017  (4)  tamoxifen started 05/10/2017  (5) breast density D.   PLAN:  Ms. Janny is now done with 5 years of antiestrogen therapy. She is still reluctant to consider MRI screening for breast cancer since she is worried about long-term side effects of MRI as well as the increased sensitivity  which may occasionally lead to more biopsies. At this time since she is 5 years out from her diagnosis of DCIS, we have discussed about following up with PCP versus survivorship clinic.  Since she has a [facility] PCP, she is willing to continue follow-up in the survivorship clinic. Physical examination today without any concerns, bilateral implants noted. We have once again discussed about screening, self breast exam monthly and return to clinic in 1 year or sooner as needed  Total time spent: 30 minutes  *Total Encounter Time as defined by the Centers for Medicare and Medicaid Services includes, in addition to the face-to-face time of a patient visit (documented in the note above) non-face-to-face time: obtaining and reviewing outside history, ordering and reviewing medications, tests or procedures, care coordination (communications with other health care professionals or caregivers) and documentation in the medical record.

## 2022-10-15 ENCOUNTER — Other Ambulatory Visit: Payer: Self-pay | Admitting: *Deleted

## 2022-10-15 DIAGNOSIS — R232 Flushing: Secondary | ICD-10-CM

## 2022-10-15 MED ORDER — GABAPENTIN 100 MG PO CAPS: ORAL_CAPSULE | ORAL | 3 refills | Status: DC

## 2023-03-16 ENCOUNTER — Other Ambulatory Visit: Payer: Self-pay

## 2023-03-16 MED ORDER — GABAPENTIN 100 MG PO CAPS: ORAL_CAPSULE | ORAL | 0 refills | Status: DC

## 2023-04-20 ENCOUNTER — Other Ambulatory Visit: Payer: Self-pay | Admitting: General Surgery

## 2023-04-20 DIAGNOSIS — D0512 Intraductal carcinoma in situ of left breast: Secondary | ICD-10-CM

## 2023-04-20 DIAGNOSIS — Z1231 Encounter for screening mammogram for malignant neoplasm of breast: Secondary | ICD-10-CM

## 2023-04-22 ENCOUNTER — Other Ambulatory Visit: Payer: Self-pay | Admitting: *Deleted

## 2023-04-22 MED ORDER — GABAPENTIN 100 MG PO CAPS: ORAL_CAPSULE | ORAL | 3 refills | Status: DC

## 2023-05-07 ENCOUNTER — Other Ambulatory Visit: Payer: Self-pay | Admitting: General Surgery

## 2023-05-07 DIAGNOSIS — Z1239 Encounter for other screening for malignant neoplasm of breast: Secondary | ICD-10-CM

## 2023-05-10 ENCOUNTER — Ambulatory Visit
Admission: RE | Admit: 2023-05-10 | Discharge: 2023-05-10 | Disposition: A | Discharge status: Home / Self Care | Payer: 59 | Source: Ambulatory Visit | Attending: General Surgery | Admitting: General Surgery

## 2023-05-10 DIAGNOSIS — Z1239 Encounter for other screening for malignant neoplasm of breast: Secondary | ICD-10-CM

## 2023-05-10 MED ORDER — IOPAMIDOL (ISOVUE-370) INJECTION 76%
100.0000 mL | Freq: Once | INTRAVENOUS | Status: AC | PRN
  Administered 2023-05-10: 100 mL via INTRAVENOUS

## 2023-06-18 ENCOUNTER — Inpatient Hospital Stay: Payer: 59 | Admitting: Hematology and Oncology

## 2023-07-21 ENCOUNTER — Inpatient Hospital Stay: Payer: 59 | Attending: Hematology and Oncology | Admitting: Hematology and Oncology

## 2023-07-21 VITALS — BP 123/55 | HR 61 | Temp 97.5°F | Resp 16 | Wt 102.2 lb

## 2023-07-21 DIAGNOSIS — C50512 Malignant neoplasm of lower-outer quadrant of left female breast: Secondary | ICD-10-CM | POA: Diagnosis not present

## 2023-07-21 DIAGNOSIS — Z09 Encounter for follow-up examination after completed treatment for conditions other than malignant neoplasm: Secondary | ICD-10-CM | POA: Insufficient documentation

## 2023-07-21 DIAGNOSIS — Z170 Estrogen receptor positive status [ER+]: Secondary | ICD-10-CM

## 2023-07-21 DIAGNOSIS — Z803 Family history of malignant neoplasm of breast: Secondary | ICD-10-CM | POA: Diagnosis not present

## 2023-07-21 DIAGNOSIS — Z923 Personal history of irradiation: Secondary | ICD-10-CM | POA: Diagnosis not present

## 2023-07-21 DIAGNOSIS — Z79899 Other long term (current) drug therapy: Secondary | ICD-10-CM | POA: Diagnosis not present

## 2023-07-21 DIAGNOSIS — Z86000 Personal history of in-situ neoplasm of breast: Secondary | ICD-10-CM | POA: Diagnosis present

## 2023-07-21 NOTE — Progress Notes (Signed)
[facility]  [phone] Fax:([phone]     ID: Toni Bowers DOB: June 21, 1966  MR#: [id number]  ONG#:[id number]  Patient Care Team: Darnelle Catalan Valentino Hue, MD (Inactive) as PCP - General (Oncology) Emelia Loron, MD as Consulting Physician (General Surgery) Magrinat, Valentino Hue, MD (Inactive) as Consulting Physician (Oncology) Dorothy Puffer, MD as Consulting Physician (Radiation Oncology) Haverstock, Elvin So, MD as Referring Physician (Dermatology) Axel Filler, Larna Daughters, NP as Nurse Practitioner (Hematology and Oncology) Odette Fraction, MD (Inactive) as Consulting Physician (Anesthesiology)   CHIEF COMPLAINT: Ductal carcinoma in situ  CURRENT TREATMENT: tamoxifen   INTERVAL HISTORY: Toni Bowers returns today for follow-up of her ductal carcinoma in situ.  Discussed the use of AI scribe software for clinical note transcription with the patient, who gave verbal consent to proceed.  History of Present Illness   The patient, with a history of breast cancer and now on surveillance, presents for a routine follow-up. She reports no new symptoms or concerns. She found the CT mammogram to be satisfactory and plans to continue with this modality for future screenings.  The patient also reports a recent change in her primary care physician due to frequent changes in the practice. She has also been experiencing hair loss, for which she has started taking Vitamin D3 supplements on the advice of her dermatologist. She is also on finasteride for the same issue.  The patient has completed a five-year course of tamoxifen for her breast cancer and is no longer taking it.  Rest of the pertinent 10 point ROS reviewed and negative  BREAST CANCER HISTORY: From the original intake note:  [name] had routine bilateral screening [facility]. The breast density was category D. The patient has retropectoral implants in  place. There was a possible mass or area of distortion in the right breast and on 01/12/2017 the patient underwent right diagnostic mammography with tomography and ultrasonography. This time the breast density was read as category C.   In the upper outer right breast there was an area of persistent distortion. This was not palpable. This area was sonographically silent and there was no right axillary adenopathy.  In the left breast however there were 2 separate groups of calcifications 1 measuring 0.4 cm in the upper outer quadrant and one measuring 1.0 cm in the lateral left breast.  On 01/19/2017 the patient underwent biopsy of the suspicious area in the upper-outer quadrant of the right breast and this showed ([id number]) a complex sclerosing lesion. On the same day in the left breast the patient had biopsies of the 2 areas of calcification of concern. The one in the upper-outer quadrant was benign. The one in the lower outer quadrant showed ductal carcinoma in situ, high-grade, estrogen receptor positive at 95%, and progesterone receptor positive at 70%, both with strong staining intensity.  Her subsequent history is as detailed below   PAST MEDICAL HISTORY: Past Medical History:  Diagnosis Date   Anxiety    Breast cancer (HCC) 2018   Left Breast Cancer   Chronic constipation    Hypertension    Internal prolapsed hemorrhoids    and bleeding   Lactose intolerance    Personal history of radiation therapy    Wears glasses     PAST SURGICAL HISTORY: Past Surgical History:  Procedure Laterality Date   AUGMENTATION MAMMAPLASTY Bilateral 1999   retro-pectoral saline   BREAST EXCISIONAL BIOPSY Right 01/2017   benign   BREAST IMPLANT EXCHANGE  01/  2016   BREAST LUMPECTOMY Left 01/2017   BREAST LUMPECTOMY WITH RADIOACTIVE SEED LOCALIZATION Left 02/15/2017   Procedure: LEFT BREAST LUMPECTOMY WITH RADIOACTIVE SEED LOCALIZATION X 2;  Surgeon: Emelia Loron, MD;  Location: [facility];  Service: General;  Laterality: Left;   HEMORRHOID SURGERY N/A 10/17/2015   Procedure: HEMORRHOIDECTOMY;  Surgeon: Avel Peace, MD;  Location: [facility];  Service: General;  Laterality: N/A;   PLACEMENT OF BREAST IMPLANTS Bilateral 1999   RADIOACTIVE SEED GUIDED EXCISIONAL BREAST BIOPSY Right 02/15/2017   Procedure: RIGHT BREAST SEED GUIDED EXCISIONAL BREAST BIOPSY;  Surgeon: Emelia Loron, MD;  Location:  SURGERY CENTER;  Service: General;  Laterality: Right;    FAMILY HISTORY Family History  Problem Relation Age of Onset   Breast cancer Mother 37  The patient's mother, Toni Bowers, died from breast cancer, recurrent, in 2013 at age 26. The patient's father is still living at age 16 as of May 2018. The patient has one brother, no sisters. There is no other history of breast or ovarian cancer in the family.   GYNECOLOGIC HISTORY:  Patient's last menstrual period was 10/06/2015 (exact date). Menarche age 39, the patient is GX P0. She stopped having periods October 2017 but then they resumed March 2018 and came right on time in April 2018.   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  Her husband Toni Bowers runs the [employer] out of [place]. At home is just the 2 of them plus 2 chihuahuas    ADVANCED DIRECTIVES: In place   HEALTH MAINTENANCE: Social History   Tobacco Use   Smoking status: Every Day    Current packs/day: 1.00    Average packs/day: 1 pack/day for 30.0 years (30.0 ttl pk-yrs)    Types: Cigarettes   Smokeless tobacco: Never  Vaping Use   Vaping status: Never Used  Substance Use Topics   Alcohol use: No   Drug use: No     Colonoscopy: October 2016  PAP:  Bone density: 2004?   Allergies  Allergen Reactions   Codeine Nausea And Vomiting   Metoprolol Succinate [Metoprolol] Swelling    Throat  swelling   Zoloft [Sertraline Hcl] Swelling    "throat closes"   Adhesive [Tape] Itching, Swelling and Rash    Current Outpatient Medications  Medication Sig Dispense Refill   azelastine (ASTELIN) 0.1 % nasal spray Place 1 spray into both nostrils 2 (two) times daily. Use in each nostril as directed     Biotin 5000 MCG CAPS Take 1 capsule by mouth every morning.     busPIRone (BUSPAR) 10 MG tablet Take 10 mg by mouth daily.      Calcium Carbonate-Vitamin D (CALCIUM 600+D) 600-400 MG-UNIT tablet Take 1 tablet by mouth daily.     gabapentin (NEURONTIN) 100 MG capsule Take two to three capsules by mouth every night at bedtime 90 capsule 3   ibuprofen (ADVIL,MOTRIN) 200 MG tablet Take 800 mg by mouth every 6 (six) hours as needed.     lisinopril (PRINIVIL,ZESTRIL) 5 MG tablet Take 6 mg by mouth daily.      Multiple Vitamin (MULTIVITAMIN) tablet Take 1 tablet by mouth daily.     Omega-3 Fatty Acids (FISH OIL) 1200 MG CAPS Take 1 capsule by mouth daily.     polycarbophil (FIBERCON) 625 MG tablet Take 625 mg by mouth 2 (two)  times daily.     tamoxifen (NOLVADEX) 20 MG tablet Take 1 tablet (20 mg total) by mouth daily. 90 tablet 3   vitamin B-12 (CYANOCOBALAMIN) 1000 MCG tablet Take 1,000 mcg by mouth daily.     No current facility-administered medications for this visit.    OBJECTIVE: White woman who appears younger than stated age  Vitals:   07/21/23 1317  BP: (!) 123/55  Pulse: 61  Resp: 16  Temp: (!) 97.5 F (36.4 C)  SpO2: 100%      Body mass index is 18.1 kg/m.    ECOG FS:0 - Asymptomatic  Physical Exam Constitutional:      Appearance: Normal appearance.  Chest:     Comments: Bilateral breasts inspected. She has bilateral intact implants.  No palpable masses or regional adenopathy Musculoskeletal:     Cervical back: Normal range of motion and neck supple. No rigidity.  Lymphadenopathy:     Cervical: No cervical adenopathy.  Skin:    General: Skin is warm and dry.   Neurological:     Mental Status: She is alert.     LAB RESULTS:  CMP     Component Value Date/Time   NA 143 01/27/2017 0852   K 4.1 01/27/2017 0852   CL 102 10/14/2015 1258   CO2 30 (H) 01/27/2017 0852   GLUCOSE 86 01/27/2017 0852   BUN 9.1 01/27/2017 0852   CREATININE 0.8 01/27/2017 0852   CALCIUM 10.8 (H) 01/27/2017 0852   PROT 7.4 01/27/2017 0852   ALBUMIN 4.4 01/27/2017 0852   AST 24 01/27/2017 0852   ALT 21 01/27/2017 0852   ALKPHOS 70 01/27/2017 0852   BILITOT 0.51 01/27/2017 0852   GFRNONAA >60 10/14/2015 1258   GFRAA >60 10/14/2015 1258    No results found for: "TOTALPROTELP", "ALBUMINELP", "A1GS", "A2GS", "BETS", "BETA2SER", "GAMS", "MSPIKE", "SPEI"  No results found for: "KPAFRELGTCHN", "LAMBDASER", "KAPLAMBRATIO"  Lab Results  Component Value Date   WBC 6.9 01/27/2017   NEUTROABS 3.9 01/27/2017   HGB 14.7 01/27/2017   HCT 41.9 01/27/2017   MCV 96.8 01/27/2017   PLT 233 01/27/2017      Chemistry      Component Value Date/Time   NA 143 01/27/2017 0852   K 4.1 01/27/2017 0852   CL 102 10/14/2015 1258   CO2 30 (H) 01/27/2017 0852   BUN 9.1 01/27/2017 0852   CREATININE 0.8 01/27/2017 0852      Component Value Date/Time   CALCIUM 10.8 (H) 01/27/2017 0852   ALKPHOS 70 01/27/2017 0852   AST 24 01/27/2017 0852   ALT 21 01/27/2017 0852   BILITOT 0.51 01/27/2017 0852       No results found for: "LABCA2"  No components found for: "WUJWJX914"  No results for input(s): "INR" in the last 168 hours.  Urinalysis No results found for: "COLORURINE", "APPEARANCEUR", "LABSPEC", "PHURINE", "GLUCOSEU", "HGBUR", "BILIRUBINUR", "KETONESUR", "PROTEINUR", "UROBILINOGEN", "NITRITE", "LEUKOCYTESUR"   STUDIES: No results found.   ELIGIBLE FOR AVAILABLE RESEARCH PROTOCOL: no  ASSESSMENT: 57 y.o. [place], [place] woman status post left breast lower outer quadrant biopsy 01/19/2017 for ductal carcinoma in situ, grade 3, estrogen and progesterone  receptor positive  (1) right breast upper outer quadrant biopsy 01/19/2017 shows a complex sclerosing lesion  (2) Bilateral lumpectomies 02/15/2017 showed  (a) on the right, only a complex sclerosing lesion  (b) in the left lower outer quadrant ductal carcinoma in situ measuring 1.5 cm, with negative margins  (3) adjuvant radiation completed 05/04/2017  (4) tamoxifen started 05/10/2017  (  5) breast density D.   PLAN:  Assessment and Plan    Breast Cancer Surveillance Patient underwent contrast-enhanced mammogram with no reported abnormalities. No palpable masses or abnormalities on physical exam. -Order contrast-enhanced mammogram for next year on 05/22/2024. She didn't want to consider MRI breast given unknown long term risks.  Hair Loss Patient reports hair loss and has started taking Vitamin D3 2000 units daily on the recommendation of her dermatologist. She is also on finasteride. -Continue current regimen and reassess effectiveness at next visit.  Medication Update Patient has completed five years of tamoxifen therapy. -Remove tamoxifen from medication list.   Total time spent: 20 minutes  *Total Encounter Time as defined by the Centers for Medicare and Medicaid Services includes, in addition to the face-to-face time of a patient visit (documented in the note above) non-face-to-face time: obtaining and reviewing outside history, ordering and reviewing medications, tests or procedures, care coordination (communications with other health care professionals or caregivers) and documentation in the medical record.

## 2023-08-02 ENCOUNTER — Other Ambulatory Visit: Payer: Self-pay | Admitting: Family Medicine

## 2023-08-02 DIAGNOSIS — E2839 Other primary ovarian failure: Secondary | ICD-10-CM

## 2023-09-09 ENCOUNTER — Telehealth: Payer: Self-pay | Admitting: *Deleted

## 2023-09-09 MED ORDER — GABAPENTIN 100 MG PO CAPS: ORAL_CAPSULE | ORAL | 3 refills | Status: DC

## 2023-09-10 NOTE — Telephone Encounter (Signed)
Refill sent per pt request.  

## 2024-03-17 ENCOUNTER — Other Ambulatory Visit: Payer: 59

## 2024-03-28 ENCOUNTER — Ambulatory Visit (HOSPITAL_BASED_OUTPATIENT_CLINIC_OR_DEPARTMENT_OTHER)
Admission: RE | Admit: 2024-03-28 | Discharge: 2024-03-28 | Disposition: A | Discharge status: Home / Self Care | Source: Ambulatory Visit | Attending: Family Medicine | Admitting: Family Medicine

## 2024-03-28 DIAGNOSIS — E2839 Other primary ovarian failure: Secondary | ICD-10-CM | POA: Insufficient documentation

## 2024-04-06 ENCOUNTER — Telehealth: Payer: Self-pay | Admitting: Hematology and Oncology

## 2024-04-06 NOTE — Telephone Encounter (Signed)
 Called to reschedule patient appointment to due provider pal  request. I talked  to patient and they are aware of the changes that was made to the upcoming appointment

## 2024-04-18 ENCOUNTER — Other Ambulatory Visit: Payer: Self-pay | Admitting: *Deleted

## 2024-04-18 MED ORDER — GABAPENTIN 100 MG PO CAPS: ORAL_CAPSULE | ORAL | 3 refills | Status: AC

## 2024-05-11 ENCOUNTER — Ambulatory Visit
Admission: RE | Admit: 2024-05-11 | Discharge: 2024-05-11 | Disposition: A | Discharge status: Home / Self Care | Source: Ambulatory Visit | Attending: Hematology and Oncology | Admitting: Hematology and Oncology

## 2024-05-11 DIAGNOSIS — C50512 Malignant neoplasm of lower-outer quadrant of left female breast: Secondary | ICD-10-CM

## 2024-05-11 MED ORDER — IOPAMIDOL (ISOVUE-370) INJECTION 76%
100.0000 mL | Freq: Once | INTRAVENOUS | Status: AC | PRN
  Administered 2024-05-11: 100 mL via INTRAVENOUS

## 2024-05-15 ENCOUNTER — Ambulatory Visit (INDEPENDENT_AMBULATORY_CARE_PROVIDER_SITE_OTHER)

## 2024-05-15 ENCOUNTER — Ambulatory Visit: Admitting: Podiatry

## 2024-05-15 ENCOUNTER — Encounter: Payer: Self-pay | Admitting: Podiatry

## 2024-05-15 DIAGNOSIS — M778 Other enthesopathies, not elsewhere classified: Secondary | ICD-10-CM

## 2024-05-15 DIAGNOSIS — F411 Generalized anxiety disorder: Secondary | ICD-10-CM | POA: Insufficient documentation

## 2024-05-15 DIAGNOSIS — J309 Allergic rhinitis, unspecified: Secondary | ICD-10-CM | POA: Insufficient documentation

## 2024-05-15 DIAGNOSIS — M216X9 Other acquired deformities of unspecified foot: Secondary | ICD-10-CM

## 2024-05-15 DIAGNOSIS — M7751 Other enthesopathy of right foot: Secondary | ICD-10-CM

## 2024-05-15 DIAGNOSIS — M810 Age-related osteoporosis without current pathological fracture: Secondary | ICD-10-CM | POA: Insufficient documentation

## 2024-05-15 DIAGNOSIS — E739 Lactose intolerance, unspecified: Secondary | ICD-10-CM | POA: Insufficient documentation

## 2024-05-15 DIAGNOSIS — M7741 Metatarsalgia, right foot: Secondary | ICD-10-CM

## 2024-05-15 DIAGNOSIS — E209 Hypoparathyroidism, unspecified: Secondary | ICD-10-CM | POA: Insufficient documentation

## 2024-05-15 DIAGNOSIS — R7989 Other specified abnormal findings of blood chemistry: Secondary | ICD-10-CM | POA: Insufficient documentation

## 2024-05-15 DIAGNOSIS — C50919 Malignant neoplasm of unspecified site of unspecified female breast: Secondary | ICD-10-CM | POA: Insufficient documentation

## 2024-05-15 DIAGNOSIS — K580 Irritable bowel syndrome with diarrhea: Secondary | ICD-10-CM | POA: Insufficient documentation

## 2024-05-15 DIAGNOSIS — M7742 Metatarsalgia, left foot: Secondary | ICD-10-CM

## 2024-05-15 DIAGNOSIS — I10 Essential (primary) hypertension: Secondary | ICD-10-CM | POA: Insufficient documentation

## 2024-05-15 DIAGNOSIS — F17200 Nicotine dependence, unspecified, uncomplicated: Secondary | ICD-10-CM | POA: Insufficient documentation

## 2024-05-15 NOTE — Progress Notes (Unsigned)
 Chief Complaint  Patient presents with   Foot Pain    Bilateral ball of feet  experiencing stabbing pain. Has had foot issues for over 10 yrs. Pain 7 comes and goes. Morning is worse. Non diabetic.  No anti coags. Calluses bilateral Great toe medial    HPI: 58 y.o. female presents today with concern of pain in the ball of the foot bilateral.  She has a history of neuroma, but states this is not the same type of symptoms.  She does have a history of osteoporosis.  Denies injury.  Denies swelling or bruising  Past Medical History:  Diagnosis Date   Anxiety    Breast cancer (HCC) 2018   Left Breast Cancer   Chronic constipation    Hypertension    Internal prolapsed hemorrhoids    and bleeding   Lactose intolerance    Personal history of radiation therapy    Wears glasses    Past Surgical History:  Procedure Laterality Date   AUGMENTATION MAMMAPLASTY Bilateral 1999   retro-pectoral saline   BREAST EXCISIONAL BIOPSY Right 01/2017   benign   BREAST IMPLANT EXCHANGE  01/  2016   BREAST LUMPECTOMY Left 01/2017   BREAST LUMPECTOMY WITH RADIOACTIVE SEED LOCALIZATION Left 02/15/2017   Procedure: LEFT BREAST LUMPECTOMY WITH RADIOACTIVE SEED LOCALIZATION X 2;  Surgeon: Ebbie Cough, MD;  Location: [facility];  Service: General;  Laterality: Left;   HEMORRHOID SURGERY N/A 10/17/2015   Procedure: HEMORRHOIDECTOMY;  Surgeon: Krystal Russell, MD;  Location: [facility];  Service: General;  Laterality: N/A;   PLACEMENT OF BREAST IMPLANTS Bilateral 1999   RADIOACTIVE SEED GUIDED EXCISIONAL BREAST BIOPSY Right 02/15/2017   Procedure: RIGHT BREAST SEED GUIDED EXCISIONAL BREAST BIOPSY;  Surgeon: Ebbie Cough, MD;  Location: [facility];  Service: General;  Laterality: Right;   Allergies  Allergen Reactions   Codeine Nausea And Vomiting   Metoprolol Succinate [Metoprolol] Swelling    Throat swelling   Zoloft [Sertraline Hcl] Swelling     throat closes   Adhesive [Tape] Itching, Swelling and Rash    Physical Exam: Palpable pedal pulses noted bilateral.  No edema is appreciated.  Mild decrease in fat pad thickness at the ball of the foot bilateral.  Patient has a flexible pes cavus foot deformity bilateral.  Pain on palpation on the plantar aspect of the second metatarsal head, with less pain on palpation to the 3rd and 4th metatarsal heads.  This is bilateral.  Epicritic sensation intact.  Manual muscle testing 5 out of 5 bilateral.  Ankle dorsiflexion slightly less than 10 degrees with the knee extended bilateral.  Radiographic Exam (bilateral foot, 3WB views, 05/15/24):  Decreased osseous mineralization. No fractures noted.  Slightly shortened left first metatarsal.  Decreased talar declination angle on lateral view bilateral.  Increased first metatarsal declination angle bilateral.  Assessment/Plan of Care: 1. Capsulitis of foot, left   2. Capsulitis of ankle, right   3. Metatarsalgia of both feet   4. Cavus deformity of foot, acquired     ORTHOTICS, PODIATRY (AMBULATORY)  Discussed clinical findings with the patient today.  Most of her foot deformity is in the sagittal plane, therefore she would benefit from custom molded orthotics with a heel lift and semirigid arch.  She may need a metatarsal pad to slightly offload the metatarsal heads.  Order placed for the custom molded orthotics and will get her scheduled with our pedorthist in the near future.   Humbert Morozov D.  Abigayle Wilinski, DPM, FACFAS [facility]     [address] [place], [place] [address]                Office [phone]  Fax [phone]

## 2024-05-26 ENCOUNTER — Ambulatory Visit

## 2024-05-26 NOTE — Progress Notes (Signed)
 Orthotics   Patient was present and evaluated for Custom molded foot orthotics. Patient will benefit from CFO's to provide total contact to BIL MLA's helping to balance and distribute body weight more evenly across BIL feet helping to reduce plantar pressure and pain. Orthotic will also encourage FF / RF alignment  Patient was scanned today and will return for fitting upon receipt  Nothing met towards $2000 ded  Patient is aware of oop and financial form signed will need to add charges upon delivery   Lolita Schultze CPed

## 2024-07-18 ENCOUNTER — Telehealth: Payer: Self-pay

## 2024-07-18 NOTE — Telephone Encounter (Signed)
 Spoke with patient and confirmed appointment on 10/22

## 2024-07-19 ENCOUNTER — Inpatient Hospital Stay: Attending: Hematology and Oncology | Admitting: Hematology and Oncology

## 2024-07-19 DIAGNOSIS — Z1721 Progesterone receptor positive status: Secondary | ICD-10-CM | POA: Diagnosis not present

## 2024-07-19 DIAGNOSIS — Z79899 Other long term (current) drug therapy: Secondary | ICD-10-CM | POA: Diagnosis not present

## 2024-07-19 DIAGNOSIS — C50512 Malignant neoplasm of lower-outer quadrant of left female breast: Secondary | ICD-10-CM | POA: Diagnosis not present

## 2024-07-19 DIAGNOSIS — F17210 Nicotine dependence, cigarettes, uncomplicated: Secondary | ICD-10-CM | POA: Diagnosis not present

## 2024-07-19 DIAGNOSIS — Z803 Family history of malignant neoplasm of breast: Secondary | ICD-10-CM | POA: Diagnosis not present

## 2024-07-19 DIAGNOSIS — Z923 Personal history of irradiation: Secondary | ICD-10-CM | POA: Insufficient documentation

## 2024-07-19 DIAGNOSIS — Z170 Estrogen receptor positive status [ER+]: Secondary | ICD-10-CM | POA: Insufficient documentation

## 2024-07-19 DIAGNOSIS — D0512 Intraductal carcinoma in situ of left breast: Secondary | ICD-10-CM | POA: Diagnosis present

## 2024-07-19 NOTE — Progress Notes (Signed)
 [facility]  [phone]) [phone] Fax:([phone]) [phone]     ID: Toni Bowers DOB: 02/09/1966  MR#: [id number]  RDW#:[id number]  Patient Care Team: Layla Sandria BROCKS, MD (Inactive) as PCP - General (Oncology) Ebbie Cough, MD as Consulting Physician (General Surgery) Dewey Rush, MD as Consulting Physician (Radiation Oncology) Haverstock, Tawni CROME, MD as Referring Physician (Dermatology) Crawford, Morna Pickle, NP as Nurse Practitioner (Hematology and Oncology) Mindi Mt, MD (Inactive) as Consulting Physician (Anesthesiology)   CHIEF COMPLAINT: Ductal carcinoma in situ  CURRENT TREATMENT: tamoxifen    INTERVAL HISTORY:  Toni Bowers returns today for follow-up of her ductal carcinoma in situ.  Discussed the use of AI scribe software for clinical note transcription with the patient, who gave verbal consent to proceed.  History of Present Illness [name] is a 58 year old female with history of DCIS who is here for follow up.  She has been experiencing significant hair loss, describing it as 'terrible' and 'really bad now.' She has consulted a dermatologist who prescribed finasteride, but she has not noticed improvement. She attributes this to stress and poor nutrition over the summer.  Her father passed away in March 09, 2024, which has been a source of stress. She has been occupied with cleaning out his house, contributing to weight loss due to being 'busy and moving stuff.' She attributes the weight loss to stress and not having time to take care of herself.  Her last contrast mammogram in August was normal, and she has not noticed any changes in her breast. She has not seen any other doctors recently and does not believe any of her current medications are causing her symptoms.  Rest of the pertinent 10 point ROS reviewed and negative  BREAST CANCER HISTORY: From the original intake note:  Toni Bowers had routine bilateral screening  [facility]. The breast density was category D. The patient has retropectoral implants in place. There was a possible mass or area of distortion in the right breast and on 01/12/2017 the patient underwent right diagnostic mammography with tomography and ultrasonography. This time the breast density was read as category C.   In the upper outer right breast there was an area of persistent distortion. This was not palpable. This area was sonographically silent and there was no right axillary adenopathy.  In the left breast however there were 2 separate groups of calcifications 1 measuring 0.4 cm in the upper outer quadrant and one measuring 1.0 cm in the lateral left breast.  On 01/19/2017 the patient underwent biopsy of the suspicious area in the upper-outer quadrant of the right breast and this showed ([id number]) a complex sclerosing lesion. On the same day in the left breast the patient had biopsies of the 2 areas of calcification of concern. The one in the upper-outer quadrant was benign. The one in the lower outer quadrant showed ductal carcinoma in situ, high-grade, estrogen receptor positive at 95%, and progesterone receptor positive at 70%, both with strong staining intensity.  Her subsequent history is as detailed below   PAST MEDICAL HISTORY: Past Medical History:  Diagnosis Date   Anxiety    Breast cancer (HCC) 2018   Left Breast Cancer   Chronic constipation    Hypertension    Internal prolapsed hemorrhoids    and bleeding   Lactose intolerance    Personal history of radiation therapy    Wears glasses     PAST SURGICAL HISTORY: Past Surgical History:  Procedure Laterality Date  AUGMENTATION MAMMAPLASTY Bilateral 1999   retro-pectoral saline   BREAST EXCISIONAL BIOPSY Right 01/2017   benign   BREAST IMPLANT EXCHANGE  01/  2016   BREAST LUMPECTOMY Left 01/2017   BREAST LUMPECTOMY WITH RADIOACTIVE SEED LOCALIZATION Left  02/15/2017   Procedure: LEFT BREAST LUMPECTOMY WITH RADIOACTIVE SEED LOCALIZATION X 2;  Surgeon: Ebbie Cough, MD;  Location: [facility];  Service: General;  Laterality: Left;   HEMORRHOID SURGERY N/A 10/17/2015   Procedure: HEMORRHOIDECTOMY;  Surgeon: Krystal [facility], MD;  [facility];  Service: General;  Laterality: N/A;   PLACEMENT OF BREAST IMPLANTS Bilateral 1999   RADIOACTIVE SEED GUIDED EXCISIONAL BREAST BIOPSY Right 02/15/2017   Procedure: RIGHT BREAST SEED GUIDED EXCISIONAL BREAST BIOPSY;  Surgeon: Ebbie Cough, MD;  Location: [facility];  Service: General;  Laterality: Right;    FAMILY HISTORY Family History  Problem Relation Age of Onset   Breast cancer Mother 41  The patient's mother, Toni Bowers, died from breast cancer, recurrent, in 2013 at age 28. The patient's father is still living at age 42 as of May 2018. The patient has one brother, no sisters. There is no other history of breast or ovarian cancer in the family.   GYNECOLOGIC HISTORY:  Patient's last menstrual period was 10/06/2015 (exact date). Menarche age 19, the patient is GX P0. She stopped having periods October 2017 but then they resumed March 2018 and came right on time in April 2018.   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  Her husband Toni Bowers runs the [employer] out of [place]. At home is just the 2 of them plus 2 chihuahuas    ADVANCED DIRECTIVES: In place   HEALTH MAINTENANCE: Social History   Tobacco Use   Smoking status: Every Day    Current packs/day: 1.00    Average packs/day: 1 pack/day for 30.0 years (30.0 ttl pk-yrs)    Types: Cigarettes   Smokeless tobacco: Never  Vaping Use   Vaping status: Never Used  Substance Use Topics   Alcohol use: No   Drug use: No     Colonoscopy: October 2016  PAP:  Bone  density: 2004?   Allergies  Allergen Reactions   Codeine Nausea And Vomiting   Metoprolol Succinate [Metoprolol] Swelling    Throat swelling   Zoloft [Sertraline Hcl] Swelling    throat closes   Adhesive [Tape] Itching, Swelling and Rash    Current Outpatient Medications  Medication Sig Dispense Refill   azelastine (ASTELIN) 0.1 % nasal spray Place 1 spray into both nostrils 2 (two) times daily. Use in each nostril as directed     Biotin 5000 MCG CAPS Take 1 capsule by mouth every morning.     busPIRone (BUSPAR) 10 MG tablet Take 10 mg by mouth daily.      Calcium Carbonate-Vitamin D (CALCIUM 600+D) 600-400 MG-UNIT tablet Take 1 tablet by mouth daily.     gabapentin  (NEURONTIN ) 100 MG capsule Take two to three capsules by mouth every night at bedtime 90 capsule 3   ibuprofen (ADVIL,MOTRIN) 200 MG tablet Take 800 mg by mouth every 6 (six) hours as needed.     lisinopril (PRINIVIL,ZESTRIL) 5 MG tablet Take 6 mg by mouth daily.      Multiple Vitamin (MULTIVITAMIN) tablet Take 1 tablet by mouth daily.     Omega-3 Fatty Acids (FISH  OIL) 1200 MG CAPS Take 1 capsule by mouth daily.     polycarbophil (FIBERCON) 625 MG tablet Take 625 mg by mouth 2 (two) times daily.     vitamin B-12 (CYANOCOBALAMIN) 1000 MCG tablet Take 1,000 mcg by mouth daily.     No current facility-administered medications for this visit.    OBJECTIVE: White woman who appears younger than stated age  There were no vitals filed for this visit.     There is no height or weight on file to calculate BMI.    ECOG FS:0 - Asymptomatic  Physical Exam Constitutional:      Appearance: Normal appearance.  Chest:     Comments: Bilateral breasts inspected. She has bilateral intact implants.  No palpable masses or regional adenopathy Musculoskeletal:     Cervical back: Normal range of motion and neck supple. No rigidity.  Lymphadenopathy:     Cervical: No cervical adenopathy.  Skin:    General: Skin is warm and dry.   Neurological:     Mental Status: She is alert.     LAB RESULTS:  CMP     Component Value Date/Time   NA 143 01/27/2017 0852   K 4.1 01/27/2017 0852   CL 102 10/14/2015 1258   CO2 30 (H) 01/27/2017 0852   GLUCOSE 86 01/27/2017 0852   BUN 9.1 01/27/2017 0852   CREATININE 0.8 01/27/2017 0852   CALCIUM 10.8 (H) 01/27/2017 0852   PROT 7.4 01/27/2017 0852   ALBUMIN 4.4 01/27/2017 0852   AST 24 01/27/2017 0852   ALT 21 01/27/2017 0852   ALKPHOS 70 01/27/2017 0852   BILITOT 0.51 01/27/2017 0852   GFRNONAA >60 10/14/2015 1258   GFRAA >60 10/14/2015 1258    No results found for: TOTALPROTELP, ALBUMINELP, A1GS, A2GS, BETS, BETA2SER, GAMS, MSPIKE, SPEI  No results found for: JONATHAN BONG, [facility]  Lab Results  Component Value Date   WBC 6.9 01/27/2017   NEUTROABS 3.9 01/27/2017   HGB 14.7 01/27/2017   HCT 41.9 01/27/2017   MCV 96.8 01/27/2017   PLT 233 01/27/2017      Chemistry      Component Value Date/Time   NA 143 01/27/2017 0852   K 4.1 01/27/2017 0852   CL 102 10/14/2015 1258   CO2 30 (H) 01/27/2017 0852   BUN 9.1 01/27/2017 0852   CREATININE 0.8 01/27/2017 0852      Component Value Date/Time   CALCIUM 10.8 (H) 01/27/2017 0852   ALKPHOS 70 01/27/2017 0852   AST 24 01/27/2017 0852   ALT 21 01/27/2017 0852   BILITOT 0.51 01/27/2017 0852       No results found for: LABCA2  No components found for: [id number]  No results for input(s): INR in the last 168 hours.  Urinalysis No results found for: COLORURINE, APPEARANCEUR, LABSPEC, PHURINE, GLUCOSEU, HGBUR, BILIRUBINUR, KETONESUR, PROTEINUR, UROBILINOGEN, NITRITE, LEUKOCYTESUR   STUDIES: No results found.   ELIGIBLE FOR AVAILABLE RESEARCH PROTOCOL: no  ASSESSMENT: 58 y.o. [place], [place]  woman status post left breast lower outer quadrant biopsy 01/19/2017 for ductal carcinoma in situ, grade 3, estrogen and progesterone  receptor positive  (1) right breast upper outer quadrant biopsy 01/19/2017 shows a complex sclerosing lesion  (2) Bilateral lumpectomies 02/15/2017 showed  (a) on the right, only a complex sclerosing lesion  (b) in the left lower outer quadrant ductal carcinoma in situ measuring 1.5 cm, with negative margins  (3) adjuvant radiation completed 05/04/2017  (4) tamoxifen  started 05/10/2017  (5) breast density D.  PLAN:  Breast Cancer Surveillance Patient underwent contrast-enhanced mammogram with no reported abnormalities. No palpable masses or abnormalities on physical exam. She didn't want to consider MRI breast given unknown long term risks. Ok to continue annual CEM, ordered RTC once a yr  Total time spent: 20 minutes  *Total Encounter Time as defined by the Centers for Medicare and Medicaid Services includes, in addition to the face-to-face time of a patient visit (documented in the note above) non-face-to-face time: obtaining and reviewing outside history, ordering and reviewing medications, tests or procedures, care coordination (communications with other health care professionals or caregivers) and documentation in the medical record.

## 2024-07-20 ENCOUNTER — Ambulatory Visit: Payer: 59 | Admitting: Hematology and Oncology

## 2024-07-27 ENCOUNTER — Telehealth: Payer: Self-pay

## 2024-07-27 NOTE — Telephone Encounter (Signed)
-----   Message from [facility] [place] sent at 07/19/2024  1:44 PM EDT ----- Toni Bowers  Can we email the person, cc Dr Alphia for MM contrast at [facility]  Thanks

## 2024-07-27 NOTE — Telephone Encounter (Signed)
 Email sent to [employer], Powell Molt and Dr Alphia at [facility] for CEM MM.

## 2024-08-15 ENCOUNTER — Telehealth: Payer: Self-pay

## 2024-08-15 NOTE — Telephone Encounter (Signed)
 Patient called in about the status of her inserts. I checked [employer] and did not find any information for this patient. I do see the order in [facility] but can not find any shipping information.

## 2024-09-18 ENCOUNTER — Ambulatory Visit

## 2024-09-18 DIAGNOSIS — M7742 Metatarsalgia, left foot: Secondary | ICD-10-CM | POA: Diagnosis not present

## 2024-09-18 DIAGNOSIS — M7741 Metatarsalgia, right foot: Secondary | ICD-10-CM | POA: Diagnosis not present

## 2024-09-18 NOTE — Progress Notes (Signed)
 Patient presents to pick up orthotics  She relates the metatarsal pad seems to be in the correct place, however she felt no support in her arches.  Orthotics were evaluated against her foot and there is no support of the longitudinal arch- it is too low for her natural arch.  I re scanned her today and will call when the new pair of orthotics is in for pick up.  Low metatarsal pad is incorporated like the first pair with instructions to snug arch.

## 2024-10-13 ENCOUNTER — Telehealth: Payer: Self-pay | Admitting: Podiatry

## 2024-10-13 NOTE — Telephone Encounter (Signed)
 Spoke to [facility]. Advised Orthotics are ready in the [place] office. Scheduled an appt on 10/20/24 at 3:15 p.m. to [facility]

## 2024-10-20 ENCOUNTER — Ambulatory Visit: Admitting: Podiatrist

## 2024-10-20 DIAGNOSIS — M7742 Metatarsalgia, left foot: Secondary | ICD-10-CM

## 2024-10-20 DIAGNOSIS — M7741 Metatarsalgia, right foot: Secondary | ICD-10-CM

## 2024-10-20 NOTE — Progress Notes (Signed)
 Patient presents today for pick up of re-made orthotics with a closer arch and less metatarsal padding  She relates the new orthotics are still uncomfortable and she is unable to tolerate and she does not want the orthotics  I took both pairs of orthotics back as she is likely not going to tolerate any type of custom device either due to the stiffness of the product or the thickness in her shoe.  Will speak with billing about her case.

## 2024-10-20 NOTE — Telephone Encounter (Signed)
 Patient came to Orthotic appointment The orthotics did not work and patient.She is not going to take orthotics. She stated Dr. Scherrie  was going to speak to someone in billing about charge that was already made for orthotics to get it removed.

## 2025-07-17 ENCOUNTER — Inpatient Hospital Stay: Admitting: Hematology and Oncology
